# Patient Record
Sex: Male | Born: 1970 | Race: White | Hispanic: No | Smoking: Former smoker
Health system: Southern US, Community
[De-identification: ages and names within clinical notes are randomized; demographics above are authoritative.]

## PROBLEM LIST (undated history)

## (undated) DIAGNOSIS — I509 Heart failure, unspecified: Secondary | ICD-10-CM

## (undated) DIAGNOSIS — I499 Cardiac arrhythmia, unspecified: Secondary | ICD-10-CM

## (undated) DIAGNOSIS — I10 Essential (primary) hypertension: Secondary | ICD-10-CM

## (undated) DIAGNOSIS — E119 Type 2 diabetes mellitus without complications: Secondary | ICD-10-CM

## (undated) HISTORY — DX: Heart failure, unspecified: I50.9

## (undated) HISTORY — DX: Cardiac arrhythmia, unspecified: I49.9

## (undated) SURGERY — Surgical Case
Anesthesia: *Unknown

---

## 2008-12-31 ENCOUNTER — Ambulatory Visit: Payer: Self-pay | Admitting: Internal Medicine

## 2009-12-24 ENCOUNTER — Ambulatory Visit: Payer: Self-pay | Admitting: Family Medicine

## 2013-09-26 ENCOUNTER — Ambulatory Visit: Payer: Self-pay

## 2013-09-26 LAB — RAPID STREP-A WITH REFLX: Micro Text Report: NEGATIVE

## 2013-09-28 LAB — BETA STREP CULTURE(ARMC)

## 2013-12-27 ENCOUNTER — Ambulatory Visit: Payer: Self-pay | Admitting: Family Medicine

## 2013-12-27 LAB — URINALYSIS, COMPLETE
Bilirubin,UR: NEGATIVE
GLUCOSE, UR: NEGATIVE mg/dL (ref 0–75)
KETONE: NEGATIVE
NITRITE: NEGATIVE
PH: 6 (ref 4.5–8.0)
Specific Gravity: >=1.03 (ref 1.003–1.030)
Squamous Epithelial: NONE SEEN

## 2013-12-27 LAB — GC/CHLAMYDIA PROBE AMP

## 2013-12-29 LAB — URINE CULTURE

## 2020-08-31 ENCOUNTER — Other Ambulatory Visit: Payer: Self-pay | Admitting: Gerontology

## 2020-08-31 DIAGNOSIS — I10 Essential (primary) hypertension: Secondary | ICD-10-CM

## 2020-09-13 ENCOUNTER — Ambulatory Visit: Payer: BC Managed Care – PPO

## 2020-09-26 ENCOUNTER — Other Ambulatory Visit: Payer: Self-pay

## 2020-09-26 ENCOUNTER — Ambulatory Visit
Admission: RE | Admit: 2020-09-26 | Discharge: 2020-09-26 | Disposition: A | Discharge status: Home / Self Care | Payer: BC Managed Care – PPO | Source: Ambulatory Visit | Attending: Gerontology | Admitting: Gerontology

## 2020-09-26 DIAGNOSIS — I10 Essential (primary) hypertension: Secondary | ICD-10-CM

## 2020-12-23 ENCOUNTER — Inpatient Hospital Stay
Admission: EM | Admit: 2020-12-23 | Discharge: 2020-12-27 | DRG: 291 | Disposition: A | Discharge status: Home / Self Care | Payer: BC Managed Care – PPO | Attending: Internal Medicine | Admitting: Internal Medicine

## 2020-12-23 ENCOUNTER — Emergency Department: Payer: BC Managed Care – PPO

## 2020-12-23 ENCOUNTER — Other Ambulatory Visit: Payer: Self-pay

## 2020-12-23 ENCOUNTER — Encounter: Payer: Self-pay | Admitting: Emergency Medicine

## 2020-12-23 DIAGNOSIS — E1122 Type 2 diabetes mellitus with diabetic chronic kidney disease: Secondary | ICD-10-CM | POA: Diagnosis present

## 2020-12-23 DIAGNOSIS — E876 Hypokalemia: Secondary | ICD-10-CM

## 2020-12-23 DIAGNOSIS — Z833 Family history of diabetes mellitus: Secondary | ICD-10-CM | POA: Diagnosis not present

## 2020-12-23 DIAGNOSIS — I5031 Acute diastolic (congestive) heart failure: Secondary | ICD-10-CM

## 2020-12-23 DIAGNOSIS — E119 Type 2 diabetes mellitus without complications: Secondary | ICD-10-CM | POA: Diagnosis not present

## 2020-12-23 DIAGNOSIS — I5021 Acute systolic (congestive) heart failure: Secondary | ICD-10-CM | POA: Insufficient documentation

## 2020-12-23 DIAGNOSIS — F17290 Nicotine dependence, other tobacco product, uncomplicated: Secondary | ICD-10-CM | POA: Diagnosis present

## 2020-12-23 DIAGNOSIS — N1831 Chronic kidney disease, stage 3a: Secondary | ICD-10-CM | POA: Insufficient documentation

## 2020-12-23 DIAGNOSIS — I959 Hypotension, unspecified: Secondary | ICD-10-CM | POA: Diagnosis present

## 2020-12-23 DIAGNOSIS — I4891 Unspecified atrial fibrillation: Secondary | ICD-10-CM | POA: Diagnosis present

## 2020-12-23 DIAGNOSIS — Z20822 Contact with and (suspected) exposure to covid-19: Secondary | ICD-10-CM | POA: Diagnosis present

## 2020-12-23 DIAGNOSIS — I130 Hypertensive heart and chronic kidney disease with heart failure and stage 1 through stage 4 chronic kidney disease, or unspecified chronic kidney disease: Principal | ICD-10-CM | POA: Diagnosis present

## 2020-12-23 DIAGNOSIS — R350 Frequency of micturition: Secondary | ICD-10-CM | POA: Diagnosis present

## 2020-12-23 DIAGNOSIS — E785 Hyperlipidemia, unspecified: Secondary | ICD-10-CM | POA: Diagnosis present

## 2020-12-23 DIAGNOSIS — Z8249 Family history of ischemic heart disease and other diseases of the circulatory system: Secondary | ICD-10-CM

## 2020-12-23 DIAGNOSIS — Z79899 Other long term (current) drug therapy: Secondary | ICD-10-CM

## 2020-12-23 DIAGNOSIS — N183 Chronic kidney disease, stage 3 unspecified: Secondary | ICD-10-CM

## 2020-12-23 HISTORY — DX: Essential (primary) hypertension: I10

## 2020-12-23 HISTORY — DX: Type 2 diabetes mellitus without complications: E11.9

## 2020-12-23 LAB — CBC
HCT: 46.2 % (ref 39.0–52.0)
Hemoglobin: 15.9 g/dL (ref 13.0–17.0)
MCH: 30 pg (ref 26.0–34.0)
MCHC: 34.4 g/dL (ref 30.0–36.0)
MCV: 87.2 fL (ref 80.0–100.0)
Platelets: 172 10*3/uL (ref 150–400)
RBC: 5.3 MIL/uL (ref 4.22–5.81)
RDW: 13.2 % (ref 11.5–15.5)
WBC: 6.8 10*3/uL (ref 4.0–10.5)
nRBC: 0 % (ref 0.0–0.2)

## 2020-12-23 LAB — GLUCOSE, CAPILLARY: Glucose-Capillary: 151 mg/dL — ABNORMAL HIGH (ref 70–99)

## 2020-12-23 LAB — BASIC METABOLIC PANEL
Anion gap: 15 (ref 5–15)
BUN: 14 mg/dL (ref 6–20)
CO2: 25 mmol/L (ref 22–32)
Calcium: 9.3 mg/dL (ref 8.9–10.3)
Chloride: 92 mmol/L — ABNORMAL LOW (ref 98–111)
Creatinine, Ser: 1.65 mg/dL — ABNORMAL HIGH (ref 0.61–1.24)
GFR, Estimated: 51 mL/min — ABNORMAL LOW (ref >60)
Glucose, Bld: 149 mg/dL — ABNORMAL HIGH (ref 70–99)
Potassium: 3.2 mmol/L — ABNORMAL LOW (ref 3.5–5.1)
Sodium: 132 mmol/L — ABNORMAL LOW (ref 135–145)

## 2020-12-23 LAB — TROPONIN I (HIGH SENSITIVITY)
Troponin I (High Sensitivity): 36 ng/L — ABNORMAL HIGH (ref <18)
Troponin I (High Sensitivity): 34 ng/L — ABNORMAL HIGH (ref <18)

## 2020-12-23 LAB — BRAIN NATRIURETIC PEPTIDE: B Natriuretic Peptide: 271.7 pg/mL — ABNORMAL HIGH (ref 0.0–100.0)

## 2020-12-23 LAB — MAGNESIUM: Magnesium: 1.3 mg/dL — ABNORMAL LOW (ref 1.7–2.4)

## 2020-12-23 MED ORDER — MAGNESIUM SULFATE 4 GM/100ML IV SOLN
4.0000 g | Freq: Once | INTRAVENOUS | Status: AC
  Administered 2020-12-23: 4 g via INTRAVENOUS
  Filled 2020-12-23 (×2): qty 100

## 2020-12-23 MED ORDER — SODIUM CHLORIDE 0.9 % IV SOLN: 1.0000 g | INTRAVENOUS | Status: DC

## 2020-12-23 MED ORDER — ASPIRIN EC 325 MG PO TBEC: 325.0000 mg | DELAYED_RELEASE_TABLET | Freq: Every day | ORAL | Status: DC

## 2020-12-23 MED ORDER — DILTIAZEM HCL-DEXTROSE 125-5 MG/125ML-% IV SOLN (PREMIX)
5.0000 mg/h | INTRAVENOUS | Status: DC
  Administered 2020-12-23: 5 mg/h via INTRAVENOUS

## 2020-12-23 MED ORDER — SODIUM CHLORIDE 0.9% FLUSH: 3.0000 mL | INTRAVENOUS | Status: DC | PRN

## 2020-12-23 MED ORDER — ENOXAPARIN SODIUM 40 MG/0.4ML ~~LOC~~ SOLN
40.0000 mg | SUBCUTANEOUS | Status: DC
  Administered 2020-12-23: 40 mg via SUBCUTANEOUS
  Filled 2020-12-23: qty 0.4

## 2020-12-23 MED ORDER — ACETAMINOPHEN 650 MG RE SUPP: 650.0000 mg | Freq: Four times a day (QID) | RECTAL | Status: DC | PRN

## 2020-12-23 MED ORDER — POTASSIUM CHLORIDE 20 MEQ PO PACK
40.0000 meq | PACK | Freq: Once | ORAL | Status: AC
  Administered 2020-12-23: 40 meq via ORAL

## 2020-12-23 MED ORDER — INSULIN ASPART 100 UNIT/ML ~~LOC~~ SOLN
0.0000 [IU] | Freq: Three times a day (TID) | SUBCUTANEOUS | Status: DC
  Administered 2020-12-23: 2 [IU] via SUBCUTANEOUS
  Filled 2020-12-23: qty 1

## 2020-12-23 MED ORDER — LACTATED RINGERS IV BOLUS: 1880.0000 mL | Freq: Once | INTRAVENOUS | Status: DC

## 2020-12-23 MED ORDER — ADENOSINE 12 MG/4ML IV SOLN: 12.0000 mg | Freq: Once | INTRAVENOUS | Status: DC

## 2020-12-23 MED ORDER — DILTIAZEM HCL 25 MG/5ML IV SOLN
15.0000 mg | Freq: Once | INTRAVENOUS | Status: AC
  Administered 2020-12-23: 15 mg via INTRAVENOUS

## 2020-12-23 MED ORDER — DIGOXIN 250 MCG PO TABS
0.2500 mg | ORAL_TABLET | Freq: Once | ORAL | Status: AC
  Administered 2020-12-23: 0.25 mg via ORAL
  Filled 2020-12-23: qty 1

## 2020-12-23 MED ORDER — ROSUVASTATIN CALCIUM 10 MG PO TABS
10.0000 mg | ORAL_TABLET | Freq: Every day | ORAL | Status: DC
  Administered 2020-12-24 – 2020-12-27 (×4): 10 mg via ORAL
  Filled 2020-12-23 (×4): qty 1

## 2020-12-23 MED ORDER — ADENOSINE 6 MG/2ML IV SOLN: 6.0000 mg | Freq: Once | INTRAVENOUS | Status: DC

## 2020-12-23 MED ORDER — ADENOSINE 12 MG/4ML IV SOLN
INTRAVENOUS | Status: AC
  Filled 2020-12-23: qty 4

## 2020-12-23 MED ORDER — ONDANSETRON HCL 4 MG/2ML IJ SOLN: 4.0000 mg | Freq: Four times a day (QID) | INTRAMUSCULAR | Status: DC | PRN

## 2020-12-23 MED ORDER — ADENOSINE 6 MG/2ML IV SOLN
INTRAVENOUS | Status: AC
  Filled 2020-12-23: qty 2

## 2020-12-23 MED ORDER — LINAGLIPTIN 5 MG PO TABS
5.0000 mg | ORAL_TABLET | Freq: Every day | ORAL | Status: DC
  Administered 2020-12-23 – 2020-12-27 (×5): 5 mg via ORAL
  Filled 2020-12-23 (×5): qty 1

## 2020-12-23 MED ORDER — METOPROLOL TARTRATE 5 MG/5ML IV SOLN
5.0000 mg | Freq: Once | INTRAVENOUS | Status: AC
  Administered 2020-12-23: 5 mg via INTRAVENOUS
  Filled 2020-12-23: qty 5

## 2020-12-23 MED ORDER — METOPROLOL TARTRATE 5 MG/5ML IV SOLN: 5.0000 mg | Freq: Once | INTRAVENOUS | Status: DC

## 2020-12-23 MED ORDER — DILTIAZEM HCL-DEXTROSE 125-5 MG/125ML-% IV SOLN (PREMIX)
5.0000 mg/h | INTRAVENOUS | Status: DC
  Administered 2020-12-23: 5 mg/h via INTRAVENOUS
  Administered 2020-12-23 – 2020-12-25 (×4): 15 mg/h via INTRAVENOUS
  Filled 2020-12-23 (×6): qty 125

## 2020-12-23 MED ORDER — DILTIAZEM HCL 25 MG/5ML IV SOLN
INTRAVENOUS | Status: AC
  Administered 2020-12-23: 10 mg via INTRAVENOUS
  Filled 2020-12-23: qty 5

## 2020-12-23 MED ORDER — TRAZODONE HCL 50 MG PO TABS
25.0000 mg | ORAL_TABLET | Freq: Every evening | ORAL | Status: DC | PRN
  Administered 2020-12-24 – 2020-12-26 (×3): 25 mg via ORAL
  Filled 2020-12-23 (×3): qty 1

## 2020-12-23 MED ORDER — ACETAMINOPHEN 325 MG PO TABS
650.0000 mg | ORAL_TABLET | Freq: Four times a day (QID) | ORAL | Status: DC | PRN
  Administered 2020-12-24 (×2): 650 mg via ORAL
  Filled 2020-12-23 (×2): qty 2

## 2020-12-23 MED ORDER — MAGNESIUM HYDROXIDE 400 MG/5ML PO SUSP
30.0000 mL | Freq: Every day | ORAL | Status: DC | PRN
Start: 2020-12-23 — End: 2020-12-27

## 2020-12-23 MED ORDER — DILTIAZEM HCL 25 MG/5ML IV SOLN
20.0000 mg | Freq: Once | INTRAVENOUS | Status: AC
  Administered 2020-12-23: 20 mg via INTRAVENOUS

## 2020-12-23 MED ORDER — ONDANSETRON HCL 4 MG PO TABS: 4.0000 mg | ORAL_TABLET | Freq: Four times a day (QID) | ORAL | Status: DC | PRN

## 2020-12-23 MED ORDER — LOSARTAN POTASSIUM 50 MG PO TABS
50.0000 mg | ORAL_TABLET | Freq: Every day | ORAL | Status: DC
  Administered 2020-12-23 – 2020-12-25 (×3): 50 mg via ORAL
  Filled 2020-12-23 (×4): qty 1

## 2020-12-23 MED ORDER — POTASSIUM CHLORIDE 20 MEQ PO PACK
40.0000 meq | PACK | Freq: Once | ORAL | Status: AC
  Administered 2020-12-23: 40 meq via ORAL
  Filled 2020-12-23: qty 2

## 2020-12-23 MED ORDER — DILTIAZEM HCL 25 MG/5ML IV SOLN: 10.0000 mg | Freq: Once | INTRAVENOUS | Status: AC

## 2020-12-23 MED ORDER — SODIUM CHLORIDE 0.9% FLUSH
3.0000 mL | Freq: Two times a day (BID) | INTRAVENOUS | Status: DC
  Administered 2020-12-23 – 2020-12-27 (×8): 3 mL via INTRAVENOUS

## 2020-12-23 MED ORDER — DILTIAZEM HCL 25 MG/5ML IV SOLN
INTRAVENOUS | Status: AC
  Filled 2020-12-23: qty 5

## 2020-12-23 MED ORDER — DILTIAZEM LOAD VIA INFUSION
20.0000 mg | Freq: Once | INTRAVENOUS | Status: AC
  Administered 2020-12-23: 20 mg via INTRAVENOUS
  Filled 2020-12-23: qty 20

## 2020-12-23 MED ORDER — SODIUM CHLORIDE 0.9 % IV SOLN: 250.0000 mL | INTRAVENOUS | Status: DC | PRN

## 2020-12-23 MED ORDER — SODIUM CHLORIDE 0.9 % IV BOLUS
1000.0000 mL | Freq: Once | INTRAVENOUS | Status: AC
  Administered 2020-12-23: 1000 mL via INTRAVENOUS

## 2020-12-23 MED ORDER — FUROSEMIDE 10 MG/ML IJ SOLN
40.0000 mg | Freq: Two times a day (BID) | INTRAMUSCULAR | Status: DC
  Administered 2020-12-23 – 2020-12-24 (×3): 40 mg via INTRAVENOUS
  Filled 2020-12-23 (×3): qty 4

## 2020-12-23 NOTE — ED Provider Notes (Signed)
[facility] Emergency Department Provider Note  ____________________________________________  Time seen: Approximately 5:54 PM  I have reviewed the triage vital signs and the nursing notes.   HISTORY  Chief Complaint Chest Pain and Shortness of Breath    HPI George Shaw is a 50 y.o. male who presents the emergency department  complaining of chest tightness, increasing lower extremity edema.  Patient has a history of hypertension and takes HCTZ for same.  He notes over the past 2 weeks he has had increased chest tightness, as well as peripheral edema.  Today the symptoms continue to increase so he felt like he should "be checked out."  Patient arrived and was noted in triage to have a heart rate of 190 bpm.  Patient denies any pain but again endorses tightness along his chest.  No shortness of breath.  He has no fever, chills, URI symptoms.  No abdominal complaints.  Patient states that he will experience shortness of breath when he lays flat.  Patient denies any other complaints at this time.  No recent illnesses, denies fevers or chills, nasal congestion, sore throat or cough.  No abdominal pain, nausea vomiting or diarrhea or constipation.  No urinary symptoms.        Past Medical History:  Diagnosis Date  . Diabetes mellitus without complication (HCC)   . Hypertension     There are no problems to display for this patient.     Prior to Admission medications   Not on File    Allergies Patient has no known allergies.  No family history on file.  Social History     Review of Systems  Constitutional: No fever/chills Eyes: No visual changes. No discharge ENT: No upper respiratory complaints. Cardiovascular: Positive for chest tightness, increasing peripheral lower extremity edema Respiratory: no cough. No SOB. Gastrointestinal: No abdominal pain.  No nausea, no vomiting.  No diarrhea.  No constipation. Genitourinary: Negative for dysuria.  No hematuria Musculoskeletal: Negative for musculoskeletal pain. Skin: Negative for rash, abrasions, lacerations, ecchymosis. Neurological: Negative for headaches, focal weakness or numbness.  10 System ROS otherwise negative.  ____________________________________________   PHYSICAL EXAM:  VITAL SIGNS: ED Triage Vitals  Enc Vitals Group     BP 12/23/20 1717 (!) 135/108     Pulse Rate 12/23/20 1717 (!) 178     Resp 12/23/20 1717 20     Temp 12/23/20 1717 98 F (36.7 C)     Temp src --      SpO2 12/23/20 1717 98 %     Weight 12/23/20 1712 213 lb (96.6 kg)     Height 12/23/20 1712 6\' 2"  (1.88 m)     Head Circumference --      Peak Flow --      Pain Score 12/23/20 1712 8     Pain Loc --      Pain Edu? --      Excl. in GC? --      Constitutional: Alert and oriented. Well appearing and in no acute distress. Eyes: Conjunctivae are normal. PERRL. EOMI. Head: Atraumatic. ENT:      Ears:       Nose: No congestion/rhinnorhea.      Mouth/Throat: Mucous membranes are moist.  Neck: No stridor.    Cardiovascular: Tachycardia with irregular regular rhythm.  Normal S1 and S2.  Good peripheral circulation. Respiratory: Normal respiratory effort without tachypnea or retractions. Lungs CTAB. Good air entry to the bases with no decreased or absent breath sounds. Musculoskeletal: Full  range of motion to all extremities. No gross deformities appreciated. Neurologic:  Normal speech and language. No gross focal neurologic deficits are appreciated.  Skin:  Skin is warm, dry and intact. No rash noted. Psychiatric: Mood and affect are normal. Speech and behavior are normal. Patient exhibits appropriate insight and judgement.   ____________________________________________   LABS (all labs ordered are listed, but only abnormal results are displayed)  Labs Reviewed  BASIC METABOLIC PANEL - Abnormal; Notable for the following components:      Result Value   Sodium 132 (*)    Potassium 3.2  (*)    Chloride 92 (*)    Glucose, Bld 149 (*)    Creatinine, Ser 1.65 (*)    GFR, Estimated 51 (*)    All other components within normal limits  BRAIN NATRIURETIC PEPTIDE - Abnormal; Notable for the following components:   B Natriuretic Peptide 271.7 (*)    All other components within normal limits  TROPONIN I (HIGH SENSITIVITY) - Abnormal; Notable for the following components:   Troponin I (High Sensitivity) 36 (*)    All other components within normal limits  SARS CORONAVIRUS 2 (TAT 6-24 HRS)  CBC  TROPONIN I (HIGH SENSITIVITY)   ____________________________________________  EKG  ED ECG REPORT I, Delorise Royals Tamantha Saline,  personally viewed and interpreted this ECG.   Date: 12/23/2020  EKG Time: 1713 hrs.  Rate: 185 bpm  Rhythm: there are no previous tracings available for comparison, atrial fibrillation, rate 185 bpm  Axis: Rightward axis  Intervals:none  ST&T Change: No ST elevation or depression noted  A. fib with RVR at a rate of 185 bpm.  No STEMI.  ____________________________________________  RADIOLOGY I personally viewed and evaluated these images as part of my medical decision making, as well as reviewing the written report by the radiologist.  ED Provider Interpretation: No acute cardiopulmonary abnormality  DG Chest Portable 1 View  Result Date: 12/23/2020 CLINICAL DATA:  Chest pain and dyspnea EXAM: PORTABLE CHEST 1 VIEW COMPARISON:  None. FINDINGS: Pacer pad overlies the upper left chest. Top-normal heart size. Normal mediastinal contour. No pneumothorax. No pleural effusion. Lungs appear clear, with no acute consolidative airspace disease and no pulmonary edema. IMPRESSION: No active disease. Electronically Signed   By: Delbert Phenix M.D.   On: 12/23/2020 18:52    ____________________________________________    PROCEDURES  Procedure(s) performed:    Procedures    Medications  diltiazem (CARDIZEM) 125 mg in dextrose 5% 125 mL (1 mg/mL) infusion  (15 mg/hr Intravenous Rate/Dose Change 12/23/20 1924)  digoxin (LANOXIN) tablet 0.25 mg (has no administration in time range)  sodium chloride 0.9 % bolus 1,000 mL (0 mLs Intravenous Stopped 12/23/20 1734)  diltiazem (CARDIZEM) injection 10 mg (10 mg Intravenous Given 12/23/20 1732)  diltiazem (CARDIZEM) injection 15 mg (15 mg Intravenous Given 12/23/20 1738)  diltiazem (CARDIZEM) 1 mg/mL load via infusion 20 mg (20 mg Intravenous Bolus from Bag 12/23/20 1755)  diltiazem (CARDIZEM) injection 20 mg (20 mg Intravenous Given 12/23/20 1747)     ____________________________________________   INITIAL IMPRESSION / ASSESSMENT AND PLAN / ED COURSE  Pertinent labs & imaging results that were available during my care of the patient were reviewed by me and considered in my medical decision making (see chart for details).  Review of the [place] CSRS was performed in accordance of the [facility] prior to dispensing any controlled drugs.           Patient's diagnosis is consistent with A. fib with RVR.  Patient presented to the emergency department complaining of chest tightness.  He states that he has had a progressive increase in symptoms over the last 2 weeks with peripheral lower extremity edema as well.  Patient states that he takes HCTZ for high blood pressure but has no cardiac history.  Strong family cardiac history however.  Patient arrived to triage and was found to have a heart rate of 185 bpm.  On presentation to the room, it appears that rhythm is irregular and patient was started on bolus of Cardizem.  Initial dose of 10, followed by 15, followed by 20 eventually Cardizem drip improved heart rate somewhat but patient remains tachycardic between 140s and 160s at this time.  Patient did have some mild extremity edema on physical exam but BNP is only slightly elevated and no evidence of pulmonary edema on chest x-ray.  Patient remains without chest pain throughout encounter.  Initial troponin of 36.Marland Kitchen  Discussed  the patient with hospitalist for admission.  Hospitalist will accept patient had a mission and but request that we give patient 0.25 mg of digoxin.  Emergency department to accompany his Cardizem drip.    ____________________________________________  FINAL CLINICAL IMPRESSION(S) / ED DIAGNOSES  Final diagnoses:  New onset a-fib (HCC)      NEW MEDICATIONS STARTED DURING THIS VISIT:  ED Discharge Orders    None          This chart was dictated using voice recognition software/Dragon. Despite best efforts to proofread, errors can occur which can change the meaning. Any change was purely unintentional.    Racheal Patches, PA-C 12/23/20 1924    Shaune Pollack, MD 12/24/20 [phone]

## 2020-12-23 NOTE — ED Notes (Signed)
Dr mansy at bedside 

## 2020-12-23 NOTE — ED Triage Notes (Signed)
Pt to ED via POV c/o Chest pain and shortness of breath. Pt states that his symptoms started 2 weeks ago but have gotten progressively worse. Pt reports fluid build up in his legs, ankles, and abdomen. Pt states that the fluid comes and goes. Pt is taking a fluid pill. Pt reports that he is not able to sleep due to the fluid because he feels like there is a lot of pressure on his chest. Pt also states that he is not able to eat because of the fullness in his abdomen. Pt is in NAD at this time.

## 2020-12-23 NOTE — H&P (Addendum)
[place]   PATIENT NAME: George Shaw    MR#:  [id number]  DATE OF BIRTH:  George Shaw, George Shaw  DATE OF ADMISSION:  12/23/2020  PRIMARY CARE PHYSICIAN: Lorenso Quarry, NP   Patient is coming from: Home.  REQUESTING/REFERRING PHYSICIAN: Cuthriell, Delorise Royals, PA-C  CHIEF COMPLAINT:   Chief Complaint  Patient presents with  . Chest Pain  . Shortness of Breath    HISTORY OF PRESENT ILLNESS:  George Shaw is a 50 y.o. Caucasian male with medical history significant for type II diabetes mellitus, hypertension and dyslipidemia, who presented to the emergency room with acute onset of worsening lower extremity edema over the last couple weeks with associated orthopnea and paroxysmal nocturnal dyspnea in addition to dyspnea on exertion.  He has been having mild urinary frequency without urgency or dysuria or flank pain.  Due to symptoms he has been having significant difficulty sleeping.  He stated that he was barely sleep for 45 minutes before he wakes up.  He experience 8/10 chest tightness with his symptoms which has been intermittent over the last week with associated palpitations.  He graded at 8/10 in severity today with no radiation.  He admits to mild dry cough without wheezing.  No nausea or vomiting or diaphoresis or abdominal pain.  No fever or chills. ED Course: When he came to the ER 108 with a heart rate of 178 and otherwise normal vital signs.  Labs revealed hypokalemia of 3.2 and sodium of 132 with chloride 92.  Blood glucose was 149 and creatinine 1.65 with a BUN of 14.  Magnesium level was added and came back 1.3.  High-sensitivity troponin I was 36 and later 34.  BNP was 271.7 and CBC was within normal.    COVID-19 PCR is currently pending.  EKG as reviewed by me : Showed atrial fibrillation with rapid ventricular response of 185 with right axis deviation and Q waves anteroseptally.  It has been as high as 192 with repeat EKG. Imaging: Portable chest x-ray showed no  acute cardiopulmonary disease with top normal heart size.  The patient was given 20 mg of IV Cardizem followed by IV Cardizem drip which has been titrated to 15 mg/h.  He was also given digoxin 0.25 mg IV and Lopressor 5 mg IV.  He was ordered replacement of his potassium with 40 mEq p.o. potassium chloride now in 2 hours as well as 4 g IV magnesium sulfate.  He will be admitted to a progressive unit bed for further evaluation and management. PAST MEDICAL HISTORY:   Past Medical History:  Diagnosis Date  . Diabetes mellitus without complication (HCC)   . Hypertension   -Dyslipidemia.  PAST SURGICAL HISTORY:   He denies any previous surgeries.  SOCIAL HISTORY:   Social History   Tobacco Use  . Smoking status: Not on file  . Smokeless tobacco: Not on file  Substance Use Topics  . Alcohol use: Not on file  He drinks alcohol about 3 times a week and smokes cigars.  No history of illicit drug use.  FAMILY HISTORY:  Positive for MI in both grandparents and diabetes mellitus in his father and grandfather.  DRUG ALLERGIES:  No Known Allergies  REVIEW OF SYSTEMS:   ROS As per history of present illness. All pertinent systems were reviewed above. Constitutional, HEENT, cardiovascular, respiratory, GI, GU, musculoskeletal, neuro, psychiatric, endocrine, integumentary and hematologic systems were reviewed and are otherwise negative/unremarkable except for positive findings mentioned above in the HPI.  MEDICATIONS AT HOME:   Prior to Admission medications   Not on File      VITAL SIGNS:  Blood pressure 116/79, pulse 99, temperature 98 F (36.7 C), resp. rate (!) 23, height 6\' 2"  (1.88 m), weight 96.6 kg, SpO2 100 %.  PHYSICAL EXAMINATION:  Physical Exam  GENERAL:  50 y.o.-year-old Caucasian male patient lying in the bed with no acute distress.  EYES: Pupils equal, round, reactive to light and accommodation. No scleral icterus. Extraocular muscles intact.  HEENT: Head  atraumatic, normocephalic. Oropharynx and nasopharynx clear.  NECK:  Supple, no jugular venous distention. No thyroid enlargement, no tenderness.  LUNGS: Normal breath sounds bilaterally, no wheezing, rales,rhonchi or crepitation. No use of accessory muscles of respiration.  CARDIOVASCULAR: Irregularly irregular tachycardic rhythm, S1, S2 normal. No murmurs, rubs, or gallops.  ABDOMEN: Soft, nondistended, nontender. Bowel sounds present. No organomegaly or mass.  EXTREMITIES: 2+ bilateral lower extremity pitting edema, with no cyanosis, or clubbing.  NEUROLOGIC: Cranial nerves II through XII are intact. Muscle strength 5/5 in all extremities. Sensation intact. Gait not checked.  PSYCHIATRIC: The patient is alert and oriented x 3.  Normal affect and good eye contact. SKIN: No obvious rash, lesion, or ulcer.   LABORATORY PANEL:   CBC Recent Labs  Lab 12/23/20 1720  WBC 6.8  HGB 15.9  HCT 46.2  PLT 172   ------------------------------------------------------------------------------------------------------------------  Chemistries  Recent Labs  Lab 12/23/20 1720 12/23/20 1928  NA 132*  --   K 3.2*  --   CL 92*  --   CO2 25  --   GLUCOSE 149*  --   BUN 14  --   CREATININE 1.65*  --   CALCIUM 9.3  --   MG  --  1.3*   ------------------------------------------------------------------------------------------------------------------  Cardiac Enzymes No results for input(s): TROPONINI in the last 168 hours. ------------------------------------------------------------------------------------------------------------------  RADIOLOGY:  DG Chest Portable 1 View  Result Date: 12/23/2020 CLINICAL DATA:  Chest pain and dyspnea EXAM: PORTABLE CHEST 1 VIEW COMPARISON:  None. FINDINGS: Pacer pad overlies the upper left chest. Top-normal heart size. Normal mediastinal contour. No pneumothorax. No pleural effusion. Lungs appear clear, with no acute consolidative airspace disease and no  pulmonary edema. IMPRESSION: No active disease. Electronically Signed   By: 12/25/2020 M.D.   On: 12/23/2020 18:52      IMPRESSION AND PLAN:  Active Problems:   Atrial fibrillation with rapid ventricular response (HCC)  1.  New onset atrial fibrillation with rapid ventricular response. -The patient will be admitted to a progressive unit bed. -We will continue on IV Cardizem and is currently maxed. -We give him a dose of digoxin 0.25 mg IV and Lopressor 5 mg IV. -We noticed improvement with IV Lopressor and it may be repeated. -2D echo and a cardiology consult will be obtained. -I notified Dr. 12/25/2020 about the patient. -His CHA2DS2-VASc score is 1 based on his hypertension and to based on confirmation of his acute CHF. -We will place him now on enteric-coated aspirin 325 mg p.o. daily awaiting 2D echo and cardiology evaluation.  He may need anticoagulation.  2.  Acute new onset CHF likely diastolic. -He has dyspnea, orthopnea and paroxysmal nocturnal dyspnea as well as worsening lower extremity edema with dyspnea on exertion and elevated BNP, though he does not have pulmonary edema.. -He will be diuresed with IV Lasix.  2D echo and a cardiology consult will be obtained as mentioned above.  3.  Hypokalemia and hypomagnesemia. -Both are clearly contributing to  his atrial fibrillation with rapid ventricular response. -Both will be aggressively replaced.  4.  Type 2 diabetes mellitus. -We will continue Januvia and hold off Metformin. -He will be placed on supplement coverage with NovoLog.  5.  Essential hypertension. -We will continue his Cozaar and hold off his HCTZ for now.  6.  Dyslipidemia. -We will continue statin therapy.  DVT prophylaxis: Lovenox. Code Status: full code. Family Communication:  The plan of care was discussed in details with the patient (and his wife). I answered all questions. The patient agreed to proceed with the above mentioned plan. Further management  will depend upon hospital course. Disposition Plan: Back to previous home environment Consults called: Cardiology consult as above. All the records are reviewed and case discussed with ED provider.  Status is: Inpatient  Remains inpatient appropriate because:Hemodynamically unstable, Ongoing diagnostic testing needed not appropriate for outpatient work up, Unsafe d/c plan, IV treatments appropriate due to intensity of illness or inability to take PO and Inpatient level of care appropriate due to severity of illness   Dispo: The patient is from: Home              Anticipated d/c is to: Home              Patient currently is not medically stable to d/c.   Difficult to place patient No   TOTAL TIME TAKING CARE OF THIS PATIENT: 60 minutes.    George Shaw M.D on 12/23/2020 at 8:23 PM  [facility]   From 7 PM-7 AM, contact night-coverage [email or web address]  CC: Primary care physician; Lorenso Quarry, NP

## 2020-12-24 ENCOUNTER — Encounter: Payer: Self-pay | Admitting: Family Medicine

## 2020-12-24 ENCOUNTER — Inpatient Hospital Stay (HOSPITAL_COMMUNITY)
Admit: 2020-12-24 | Discharge: 2020-12-24 | Disposition: A | Discharge status: Home / Self Care | Payer: BC Managed Care – PPO | Attending: Family Medicine | Admitting: Family Medicine

## 2020-12-24 DIAGNOSIS — I5021 Acute systolic (congestive) heart failure: Secondary | ICD-10-CM | POA: Insufficient documentation

## 2020-12-24 DIAGNOSIS — I5031 Acute diastolic (congestive) heart failure: Secondary | ICD-10-CM | POA: Diagnosis not present

## 2020-12-24 DIAGNOSIS — E876 Hypokalemia: Secondary | ICD-10-CM

## 2020-12-24 DIAGNOSIS — N1831 Chronic kidney disease, stage 3a: Secondary | ICD-10-CM | POA: Insufficient documentation

## 2020-12-24 DIAGNOSIS — E119 Type 2 diabetes mellitus without complications: Secondary | ICD-10-CM

## 2020-12-24 DIAGNOSIS — N183 Chronic kidney disease, stage 3 unspecified: Secondary | ICD-10-CM

## 2020-12-24 DIAGNOSIS — I4891 Unspecified atrial fibrillation: Secondary | ICD-10-CM

## 2020-12-24 LAB — CBC
HCT: 44 % (ref 39.0–52.0)
Hemoglobin: 15.4 g/dL (ref 13.0–17.0)
MCH: 30.9 pg (ref 26.0–34.0)
MCHC: 35 g/dL (ref 30.0–36.0)
MCV: 88.2 fL (ref 80.0–100.0)
Platelets: 133 10*3/uL — ABNORMAL LOW (ref 150–400)
RBC: 4.99 MIL/uL (ref 4.22–5.81)
RDW: 13.3 % (ref 11.5–15.5)
WBC: 5.4 10*3/uL (ref 4.0–10.5)
nRBC: 0 % (ref 0.0–0.2)

## 2020-12-24 LAB — ECHOCARDIOGRAM COMPLETE
AR max vel: 2.89 cm2
AV Area VTI: 2.88 cm2
AV Area mean vel: 2.69 cm2
AV Mean grad: 3 mmHg
AV Peak grad: 5.5 mmHg
Ao pk vel: 1.17 m/s
Area-P 1/2: 6.74 cm2
Calc EF: 40.3 %
Height: 74 in
MV VTI: 3.5 cm2
S' Lateral: 4.6 cm
Single Plane A2C EF: 32.7 %
Single Plane A4C EF: 50.5 %
Weight: 3766.4 oz

## 2020-12-24 LAB — BASIC METABOLIC PANEL
Anion gap: 14 (ref 5–15)
BUN: 13 mg/dL (ref 6–20)
CO2: 25 mmol/L (ref 22–32)
Calcium: 9.1 mg/dL (ref 8.9–10.3)
Chloride: 95 mmol/L — ABNORMAL LOW (ref 98–111)
Creatinine, Ser: 1.61 mg/dL — ABNORMAL HIGH (ref 0.61–1.24)
GFR, Estimated: 53 mL/min — ABNORMAL LOW (ref >60)
Glucose, Bld: 148 mg/dL — ABNORMAL HIGH (ref 70–99)
Potassium: 3.2 mmol/L — ABNORMAL LOW (ref 3.5–5.1)
Sodium: 134 mmol/L — ABNORMAL LOW (ref 135–145)

## 2020-12-24 LAB — HEMOGLOBIN A1C
Hgb A1c MFr Bld: 7.3 % — ABNORMAL HIGH (ref 4.8–5.6)
Mean Plasma Glucose: 162.81 mg/dL

## 2020-12-24 LAB — GLUCOSE, CAPILLARY
Glucose-Capillary: 149 mg/dL — ABNORMAL HIGH (ref 70–99)
Glucose-Capillary: 172 mg/dL — ABNORMAL HIGH (ref 70–99)
Glucose-Capillary: 176 mg/dL — ABNORMAL HIGH (ref 70–99)
Glucose-Capillary: 177 mg/dL — ABNORMAL HIGH (ref 70–99)

## 2020-12-24 LAB — HIV ANTIBODY (ROUTINE TESTING W REFLEX): HIV Screen 4th Generation wRfx: NONREACTIVE

## 2020-12-24 LAB — SARS CORONAVIRUS 2 (TAT 6-24 HRS): SARS Coronavirus 2: NEGATIVE

## 2020-12-24 LAB — MAGNESIUM: Magnesium: 1.9 mg/dL (ref 1.7–2.4)

## 2020-12-24 MED ORDER — APIXABAN 5 MG PO TABS
5.0000 mg | ORAL_TABLET | Freq: Two times a day (BID) | ORAL | Status: DC
  Administered 2020-12-24 – 2020-12-27 (×7): 5 mg via ORAL
  Filled 2020-12-24 (×7): qty 1

## 2020-12-24 MED ORDER — METOPROLOL TARTRATE 5 MG/5ML IV SOLN
5.0000 mg | Freq: Four times a day (QID) | INTRAVENOUS | Status: DC | PRN
  Administered 2020-12-25 – 2020-12-26 (×2): 5 mg via INTRAVENOUS
  Filled 2020-12-24 (×2): qty 5

## 2020-12-24 MED ORDER — APIXABAN 5 MG PO TABS: 5.0000 mg | ORAL_TABLET | Freq: Two times a day (BID) | ORAL | Status: DC

## 2020-12-24 MED ORDER — INSULIN ASPART 100 UNIT/ML ~~LOC~~ SOLN
3.0000 [IU] | Freq: Three times a day (TID) | SUBCUTANEOUS | Status: DC
  Administered 2020-12-24 – 2020-12-27 (×10): 3 [IU] via SUBCUTANEOUS
  Filled 2020-12-24 (×10): qty 1

## 2020-12-24 MED ORDER — MAGNESIUM SULFATE 2 GM/50ML IV SOLN
2.0000 g | Freq: Once | INTRAVENOUS | Status: AC
  Administered 2020-12-24: 2 g via INTRAVENOUS
  Filled 2020-12-24: qty 50

## 2020-12-24 MED ORDER — INSULIN ASPART 100 UNIT/ML ~~LOC~~ SOLN
0.0000 [IU] | Freq: Three times a day (TID) | SUBCUTANEOUS | Status: DC
  Administered 2020-12-24: 2 [IU] via SUBCUTANEOUS
  Administered 2020-12-24 – 2020-12-25 (×2): 3 [IU] via SUBCUTANEOUS
  Administered 2020-12-25 – 2020-12-27 (×6): 2 [IU] via SUBCUTANEOUS
  Filled 2020-12-24 (×9): qty 1

## 2020-12-24 MED ORDER — METOPROLOL TARTRATE 25 MG PO TABS
25.0000 mg | ORAL_TABLET | Freq: Two times a day (BID) | ORAL | Status: DC
  Administered 2020-12-24 – 2020-12-26 (×5): 25 mg via ORAL
  Filled 2020-12-24 (×5): qty 1

## 2020-12-24 MED ORDER — INSULIN ASPART 100 UNIT/ML ~~LOC~~ SOLN
0.0000 [IU] | Freq: Every day | SUBCUTANEOUS | Status: DC
  Administered 2020-12-25: 2 [IU] via SUBCUTANEOUS
  Filled 2020-12-24: qty 1

## 2020-12-24 MED ORDER — POTASSIUM CHLORIDE CRYS ER 20 MEQ PO TBCR
40.0000 meq | EXTENDED_RELEASE_TABLET | Freq: Three times a day (TID) | ORAL | Status: AC
  Administered 2020-12-24 (×3): 40 meq via ORAL
  Filled 2020-12-24 (×3): qty 2

## 2020-12-24 MED ORDER — TRAMADOL HCL 50 MG PO TABS
50.0000 mg | ORAL_TABLET | Freq: Four times a day (QID) | ORAL | Status: DC | PRN
  Administered 2020-12-24 – 2020-12-26 (×2): 50 mg via ORAL
  Filled 2020-12-24 (×2): qty 1

## 2020-12-24 NOTE — Progress Notes (Signed)
   12/24/20 1100  Clinical Encounter Type  Visited With Patient and family together  Visit Type Initial;Social support;Spiritual support  Referral From Chaplain  Consult/Referral To Chaplain   Chaplain checked on PT, while doing rounds. Chaplain ministered with a calming presence, and reflective listening. PT had a visitor, so Chaplain stated he would do a follow up visit.

## 2020-12-24 NOTE — Consult Note (Signed)
CARDIOLOGY CONSULT NOTE               Patient ID: George Shaw MRN: [id number] DOB/AGE: 18-Oct-1970 50 y.o.  Admit date: 12/23/2020 Referring Physician [facility] Primary Physician [facility] Primary Cardiologist none per patient Reason for Consultation new onset atrial fibrillation with RVR and acute CHF  HPI: 50 year old gentleman referred for evaluation of new onset atrial fibrillation with RVR and acute CHF. The patient has a history of hypertension, type II diabetes, hyperlipidemia, and alcohol use. The patient reports a 2 week history of progressive lower extremity edema, orthopnea, exertional dyspnea, and chest heaviness with and without exertion. The patient denies symptoms like this in the past. The patient presented to [facility] ER yesterday due to worsening of symptoms and palpitations yesterday. In the ER, he was noted to be in atrial fibrillation with RVR at a rate of 185 bpm. He was given IV diltiazem, IV lopressor, and digoxin, then started on diltiazem drip, aspirin, and IV Lasix. Chest xray showed no evidence of pleural effusion or edema. Admission labs notable for potassium 3.2, BUN 14, creatinine 1.65, high sensitivity troponin 36 and 34, BNP 271, magnesium 1.3. Currently, the patient reports feeling better; his swelling has improved. He currently denies palpitations. He reports a mild constant left sided chest heaviness that is worse when lying down and better when leaning forward, and is non-pleuritic. The patient denies a known history of sleep apnea. He drinks several beers at a time 3 times a week. He denies a history of a brain bleed, GI bleed, or blood transfusion.  Review of systems complete and found to be negative unless listed above     Past Medical History:  Diagnosis Date  . Diabetes mellitus without complication (HCC)   . Hypertension     History reviewed. No pertinent surgical history.  No medications prior to admission.   Social History   Socioeconomic  History  . Marital status: Legally Separated    Spouse name: Not on file  . Number of children: Not on file  . Years of education: Not on file  . Highest education level: Not on file  Occupational History  . Not on file  Tobacco Use  . Smoking status: Light Tobacco Smoker    Types: Cigars  . Smokeless tobacco: Not on file  Substance and Sexual Activity  . Alcohol use: Not on file  . Drug use: Not on file  . Sexual activity: Not on file  Other Topics Concern  . Not on file  Social History Narrative  . Not on file   Social Determinants of Health   Financial Resource Strain: Not on file  Food Insecurity: Not on file  Transportation Needs: Not on file  Physical Activity: Not on file  Stress: Not on file  Social Connections: Not on file  Intimate Partner Violence: Not on file    History reviewed. No pertinent family history.    Review of systems complete and found to be negative unless listed above      PHYSICAL EXAM  General: Well developed, well nourished, in no acute distress, sitting up in bed, smiling, appears comfortable. HEENT:  Normocephalic and atramatic Neck:  No JVD.  Lungs: Clear bilaterally to auscultation and normal effort of breathing on RA Heart: irregularly irregular, without gallops or murmurs.  Msk:  Back normal, gait not assessed. Normal strength and tone for age. Extremities: No clubbing, cyanosis with 1+ bilateral ankle edema.   Neuro: Alert and oriented X 3. Psych:  Peri [place]  affect, responds appropriately  Labs:   Lab Results  Component Value Date   WBC 5.4 12/24/2020   HGB 15.4 12/24/2020   HCT 44.0 12/24/2020   MCV 88.2 12/24/2020   PLT 133 (L) 12/24/2020    Recent Labs  Lab 12/24/20 0420  NA 134*  K 3.2*  CL 95*  CO2 25  BUN 13  CREATININE 1.61*  CALCIUM 9.1  GLUCOSE 148*   No results found for: CKTOTAL, CKMB, CKMBINDEX, TROPONINI No results found for: CHOL No results found for: HDL No results found for: LDLCALC No results  found for: TRIG No results found for: CHOLHDL No results found for: LDLDIRECT    Radiology: DG Chest Portable 1 View  Result Date: 12/23/2020 CLINICAL DATA:  Chest pain and dyspnea EXAM: PORTABLE CHEST 1 VIEW COMPARISON:  None. FINDINGS: Pacer pad overlies the upper left chest. Top-normal heart size. Normal mediastinal contour. No pneumothorax. No pleural effusion. Lungs appear clear, with no acute consolidative airspace disease and no pulmonary edema. IMPRESSION: No active disease. Electronically Signed   By: Delbert Phenix M.D.   On: 12/23/2020 18:52    EKG: atrial fibrillation with RVR  ASSESSMENT AND PLAN:  1. New onset atrial fibrillation, with a chads vasc score of 3 (HTN, T2DM, CHF), with a history of alcohol use (drinks several beers 3x/week), and possible OSA.Currently 2. Acute CHF, likely diastolic and likely precipitated by new onset atrial fibrillation. Patient reports a 2-week history of peripheral edema, exertional dyspnea, orthopnea, and constant chest heaviness with and without exertion. Chest xray negative for pulmonary edema. BNP mildly elevated to 271, high sensitivity troponin mildly elevated and down-trending (36 ->34). Started on IV Lasix. 3. Hypertension, low this morning.  4. Type II diabetes 5. Hyperlipidemia, on Crestor  Plan: 1. Agree with current therapy 2. Discontinue aspirin, start Eliquis 5 mg BID 3. Continue diltiazem drip for now; continue metoprolol tartrate 25 mg BID, and PRN digoxin 4. Continue Crestor  5. Continue IV Lasix for now with careful monitoring of blood pressure and renal function 6. 2D echocardiogram 7. Supplement potassium and recheck in morning 8. Counseled patient about limiting alcohol consumption 9. Further recommendations pending patient's initial course and results of echo.   Signed: Leanora Ivanoff PA-C 12/24/2020, 8:50 AM

## 2020-12-24 NOTE — Progress Notes (Signed)
*  PRELIMINARY RESULTS* Echocardiogram 2D Echocardiogram has been performed.  George Shaw 12/24/2020, 3:32 PM

## 2020-12-24 NOTE — Progress Notes (Addendum)
[facility] PROGRESS NOTE    Progress Note  George Shaw  ZOX:[id number] DOB: Aug 06, 1971 DOA: 12/23/2020 PCP: Lorenso Quarry, NP     Brief Narrative:   George Shaw is an 50 y.o. male past medical history significant for diabetes mellitus type 2, essential hypertension comes into the emergency room for acute lower extremity edema, PND and dyspnea on exertion that started the week prior to admission.  In the ED she was found to be tachycardic with a rate of 178, mildly hypokalemic creatinine of 1.6, magnesium 1.3 cardiac biomarkers have remained flat, BNP of 271.  12-lead EKG showed A. fib with RVR  Significant studies: 12/23/2020 chest x-ray showed no acute cardiopulmonary disease. 12/25/2018 two 2D echo  Antibiotics: None  Microbiology data: Blood culture:  Procedures: None  Assessment/Plan:   Atrial fibrillation with rapid ventricular response (HCC): Started on IV Cardizem drip despite this his heart rate continues to be elevated.  Continue digoxin, will start him on oral metoprolol and IV metoprolol as needed as needed for heart rate greater than 100.  Cardiac biomarkers have basically remained flat. His heart rate has somewhat improved from the 160s 170s to the low 100s. 2D echocardiogram has been ordered, cardiology has been consulted. Chads Vascor is 1, will await 2D echo as if he has heart failure will make him a candidate for anticoagulation.  Acute diastolic heart failure: Dyspnea orthopnea with a mildly elevated BNP. He is definitely fluid overloaded on physical exam. 2D echo has been ordered, he was started on IV Lasix and fluid restriction. He is negative about 3 L. Continue IV Lasix monitor strict I's and O's and daily weights.  Chronic kidney disease stage IIIa: No previous creatinine to compare with.  Question baseline the setting of hypertension diabetes mellitus.  Electrolyte imbalance hypokalemia/hypomagnesemia: They have remain low despite  repletion likely due to diuresis. We will replete potassium orally more aggressively.  Diabetes mellitus without complications: Hold all oral hypoglycemic agents.  A1c is pending. We will start him on long-acting insulin plus sliding scale.  Essential hypertension: Blood pressure slightly elevated he was continued on ARB, hydrochlorothiazide was discontinued. Started on IV Lasix.  DVT prophylaxis: lovenox Family Communication:Wife Status is: Inpatient  Remains inpatient appropriate because:Hemodynamically unstable   Dispo: The patient is from: Home              Anticipated d/c is to: Home              Patient currently is not medically stable to d/c.   Difficult to place patient No        Code Status:     Code Status Orders  (From admission, onward)         Start     Ordered   12/23/20 2021  Full code  Continuous        12/23/20 2023        Code Status History    This patient has a current code status but no historical code status.   Advance Care Planning Activity        IV Access:    Peripheral IV   Procedures and diagnostic studies:   DG Chest Portable 1 View  Result Date: 12/23/2020 CLINICAL DATA:  Chest pain and dyspnea EXAM: PORTABLE CHEST 1 VIEW COMPARISON:  None. FINDINGS: Pacer pad overlies the upper left chest. Top-normal heart size. Normal mediastinal contour. No pneumothorax. No pleural effusion. Lungs appear clear, with no acute consolidative airspace disease and no pulmonary edema. IMPRESSION:  No active disease. Electronically Signed   By: Delbert Phenix M.D.   On: 12/23/2020 18:52     Medical Consultants:    None.   Subjective:    George Shaw he relates his breathing is better some chest tightness but no chest pain per se.  Objective:    Vitals:   12/24/20 0000 12/24/20 0200 12/24/20 0358 12/24/20 0400  BP: 112/88 117/84  126/74  Pulse: (!) 116   (!) 104  Resp: 18 20  14   Temp: 98.4 F (36.9 C)   98.6 F (37 C)   TempSrc: Oral   Oral  SpO2: 98%   100%  Weight: 106.8 kg  106.8 kg   Height:       SpO2: 100 %   Intake/Output Summary (Last 24 hours) at 12/24/2020 0720 Last data filed at 12/24/2020 0600 Gross per 24 hour  Intake 429.59 ml  Output 3350 ml  Net -2920.41 ml   Filed Weights   12/23/20 1712 12/24/20 0000 12/24/20 0358  Weight: 96.6 kg 106.8 kg 106.8 kg    Exam: General exam: In no acute distress. Respiratory system: Good air movement and still with some crackles on the right Cardiovascular system: S1 & S2 heard, RRR.  Positive JVD Gastrointestinal system: Abdomen is nondistended, soft and nontender.  Extremities: No pedal edema. Skin: No rashes, lesions or ulcers Psychiatry: Judgement and insight appear normal. Mood & affect appropriate.    Data Reviewed:    Labs: Basic Metabolic Panel: Recent Labs  Lab 12/23/20 1720 12/23/20 1928 12/24/20 0420  NA 132*  --  134*  K 3.2*  --  3.2*  CL 92*  --  95*  CO2 25  --  25  GLUCOSE 149*  --  148*  BUN 14  --  13  CREATININE 1.65*  --  1.61*  CALCIUM 9.3  --  9.1  MG  --  1.3*  --    GFR Estimated Creatinine Clearance: 73.8 mL/min (A) (by C-G formula based on SCr of 1.61 mg/dL (H)). Liver Function Tests: No results for input(s): AST, ALT, ALKPHOS, BILITOT, PROT, ALBUMIN in the last 168 hours. No results for input(s): LIPASE, AMYLASE in the last 168 hours. No results for input(s): AMMONIA in the last 168 hours. Coagulation profile No results for input(s): INR, PROTIME in the last 168 hours. COVID-19 Labs  No results for input(s): DDIMER, FERRITIN, LDH, CRP in the last 72 hours.  Lab Results  Component Value Date   SARSCOV2NAA NEGATIVE 12/23/2020    CBC: Recent Labs  Lab 12/23/20 1720 12/24/20 0420  WBC 6.8 5.4  HGB 15.9 15.4  HCT 46.2 44.0  MCV 87.2 88.2  PLT 172 133*   Cardiac Enzymes: No results for input(s): CKTOTAL, CKMB, CKMBINDEX, TROPONINI in the last 168 hours. BNP (last 3 results) No results  for input(s): PROBNP in the last 8760 hours. CBG: Recent Labs  Lab 12/23/20 2056  GLUCAP 151*   D-Dimer: No results for input(s): DDIMER in the last 72 hours. Hgb A1c: No results for input(s): HGBA1C in the last 72 hours. Lipid Profile: No results for input(s): CHOL, HDL, LDLCALC, TRIG, CHOLHDL, LDLDIRECT in the last 72 hours. Thyroid function studies: No results for input(s): TSH, T4TOTAL, T3FREE, THYROIDAB in the last 72 hours.  Invalid input(s): FREET3 Anemia work up: No results for input(s): VITAMINB12, FOLATE, FERRITIN, TIBC, IRON, RETICCTPCT in the last 72 hours. Sepsis Labs: Recent Labs  Lab 12/23/20 1720 12/24/20 0420  WBC 6.8 5.4  Microbiology Recent Results (from the past 240 hour(s))  SARS CORONAVIRUS 2 (TAT 6-24 HRS) Nasopharyngeal Nasopharyngeal Swab     Status: None   Collection Time: 12/23/20  7:24 PM   Specimen: Nasopharyngeal Swab  Result Value Ref Range Status   SARS Coronavirus 2 NEGATIVE NEGATIVE Final    Comment: (NOTE) SARS-CoV-2 target nucleic acids are NOT DETECTED.  The SARS-CoV-2 RNA is generally detectable in upper and lower respiratory specimens during the acute phase of infection. Negative results do not preclude SARS-CoV-2 infection, do not rule out co-infections with other pathogens, and should not be used as the sole basis for treatment or other patient management decisions. Negative results must be combined with clinical observations, patient history, and epidemiological information. The expected result is Negative.  Fact Sheet for Patients: HairSlick.no  Fact Sheet for Healthcare Providers: quierodirigir.com  This test is not yet approved or cleared by the [place] FDA and  has been authorized for detection and/or diagnosis of SARS-CoV-2 by FDA under an Emergency Use Authorization (EUA). This EUA will remain  in effect (meaning this test can be used) for the duration of  the COVID-19 declaration under Se ction 564(b)(1) of the Act, 21 U.S.C. section 360bbb-3(b)(1), unless the authorization is terminated or revoked sooner.  Performed at [facility], [address], [place], [place] [address]      Medications:   . aspirin EC  325 mg Oral Daily  . enoxaparin (LOVENOX) injection  40 mg Subcutaneous Q24H  . furosemide  40 mg Intravenous Q12H  . insulin aspart  0-9 Units Subcutaneous TID PC & HS  . linagliptin  5 mg Oral Daily  . losartan  50 mg Oral Daily  . rosuvastatin  10 mg Oral Daily  . sodium chloride flush  3 mL Intravenous Q12H   Continuous Infusions: . sodium chloride    . diltiazem (CARDIZEM) infusion 15 mg/hr (12/24/20 0655)      LOS: 1 day   Marinda Elk  [facility]  12/24/2020, 7:20 AM

## 2020-12-24 NOTE — Progress Notes (Signed)
   12/23/20 2043  Assess: MEWS Score  Temp 97.8 F (36.6 C)  BP (!) 127/96  Pulse Rate (!) 118  Resp 18  Level of Consciousness Alert  SpO2 100 %  O2 Device Room Air  Assess: MEWS Score  MEWS Temp 0  MEWS Systolic 0  MEWS Pulse 2  MEWS RR 0  MEWS LOC 0  MEWS Score 2  MEWS Score Color Yellow  Assess: if the MEWS score is Yellow or Red  Were vital signs taken at a resting state? Yes  Focused Assessment No change from prior assessment  Early Detection of Sepsis Score *See Row Information* Low  MEWS guidelines implemented *See Row Information* Yes  Treat  MEWS Interventions Administered scheduled meds/treatments;Administered prn meds/treatments  Pain Scale 0-10  Pain Score 0  Take Vital Signs  Increase Vital Sign Frequency  Yellow: Q 2hr X 2 then Q 4hr X 2, if remains yellow, continue Q 4hrs  Escalate  MEWS: Escalate Yellow: discuss with charge nurse/RN and consider discussing with provider and RRT  Notify: Charge Nurse/RN  Name of Charge Nurse/RN Notified Diannia Ruder, CN  Date Charge Nurse/RN Notified 12/23/20  Time Charge Nurse/RN Notified 2046  Document  Patient Outcome Stabilized after interventions  Progress note created (see row info) Yes

## 2020-12-25 DIAGNOSIS — N1831 Chronic kidney disease, stage 3a: Secondary | ICD-10-CM

## 2020-12-25 LAB — BASIC METABOLIC PANEL
Anion gap: 12 (ref 5–15)
BUN: 18 mg/dL (ref 6–20)
CO2: 24 mmol/L (ref 22–32)
Calcium: 8.9 mg/dL (ref 8.9–10.3)
Chloride: 94 mmol/L — ABNORMAL LOW (ref 98–111)
Creatinine, Ser: 1.64 mg/dL — ABNORMAL HIGH (ref 0.61–1.24)
GFR, Estimated: 52 mL/min — ABNORMAL LOW (ref >60)
Glucose, Bld: 206 mg/dL — ABNORMAL HIGH (ref 70–99)
Potassium: 3.9 mmol/L (ref 3.5–5.1)
Sodium: 130 mmol/L — ABNORMAL LOW (ref 135–145)

## 2020-12-25 LAB — GLUCOSE, CAPILLARY
Glucose-Capillary: 210 mg/dL — ABNORMAL HIGH (ref 70–99)
Glucose-Capillary: 101 mg/dL — ABNORMAL HIGH (ref 70–99)
Glucose-Capillary: 189 mg/dL — ABNORMAL HIGH (ref 70–99)
Glucose-Capillary: 216 mg/dL — ABNORMAL HIGH (ref 70–99)

## 2020-12-25 MED ORDER — DILTIAZEM HCL 30 MG PO TABS
60.0000 mg | ORAL_TABLET | Freq: Four times a day (QID) | ORAL | Status: DC
  Administered 2020-12-25 (×2): 60 mg via ORAL
  Filled 2020-12-25 (×2): qty 2

## 2020-12-25 MED ORDER — DILTIAZEM HCL 30 MG PO TABS
90.0000 mg | ORAL_TABLET | Freq: Four times a day (QID) | ORAL | Status: DC
  Administered 2020-12-25 – 2020-12-26 (×2): 90 mg via ORAL
  Filled 2020-12-25 (×2): qty 3

## 2020-12-25 MED ORDER — FUROSEMIDE 10 MG/ML IJ SOLN
40.0000 mg | Freq: Once | INTRAMUSCULAR | Status: AC
  Administered 2020-12-25: 40 mg via INTRAVENOUS
  Filled 2020-12-25: qty 4

## 2020-12-25 MED ORDER — CALCIUM CARBONATE ANTACID 500 MG PO CHEW
1.0000 | CHEWABLE_TABLET | Freq: Four times a day (QID) | ORAL | Status: DC | PRN
  Administered 2020-12-25: 200 mg via ORAL
  Filled 2020-12-25: qty 1

## 2020-12-25 NOTE — Progress Notes (Signed)
[facility] PROGRESS NOTE    Progress Note  George CobbsMatthew Cheek  ZOX:[id number] DOB: 12/05/1970 DOA: 12/23/2020 PCP: Lorenso QuarryLeach, Shannon, NP     Brief Narrative:   George Shaw is an 50 y.o. male past medical history significant for diabetes mellitus type 2, essential hypertension comes into the emergency room for acute lower extremity edema, PND and dyspnea on exertion that started the week prior to admission.  In the ED she was found to be tachycardic with a rate of 178, mildly hypokalemic creatinine of 1.6, magnesium 1.3 cardiac biomarkers have remained flat, BNP of 271.  12-lead EKG showed A. fib with RVR  Significant studies: 12/23/2020 chest x-ray showed no acute cardiopulmonary disease. 12/25/2018 two 2D echo  Antibiotics: None  Microbiology data: Blood culture:  Procedures: None  Assessment/Plan:   Atrial fibrillation with rapid ventricular response (HCC): -Continue metoprolol transitioning to oral diltiazem. -Continue Xarelto for stroke prophylaxis. -Echo an EF of 30%, global hypokinesia all 4 chambers are dilated, aortic valve no structural abnormalities. -Chads Vascor is 3.  Acute diastolic heart failure: - 6-1/2 L, will hold IV diuresis for now. Basic metabolic panel is pending. He will probably go home off lasix and can switch back to HCTZ.  Chronic kidney disease stage IIIa: No previous creatinine to compare with.  Question baseline the setting of hypertension diabetes mellitus.  Electrolyte imbalance hypokalemia/hypomagnesemia: Basic metabolic panel is pending today.  Diabetes mellitus without complications: Hold all oral hypoglycemic agents.  A1c is 7.3. Continue current regimen long-acting insulin for sliding scale fairly controlled.  Essential hypertension: Blood pressure significantly improved, currently on ARB want to go home on hydrochlorothiazide. Also on diltiazem and metoprolol.  DVT prophylaxis: lovenox Family Communication:Wife Status  is: Inpatient  Remains inpatient appropriate because:Hemodynamically unstable   Dispo: The patient is from: Home              Anticipated d/c is to: Home              Patient currently is not medically stable to d/c.   Difficult to place patient No  Code Status:     Code Status Orders  (From admission, onward)         Start     Ordered   12/23/20 2021  Full code  Continuous        12/23/20 2023        Code Status History    This patient has a current code status but no historical code status.   Advance Care Planning Activity        IV Access:    Peripheral IV   Procedures and diagnostic studies:   DG Chest Portable 1 View  Result Date: 12/23/2020 CLINICAL DATA:  Chest pain and dyspnea EXAM: PORTABLE CHEST 1 VIEW COMPARISON:  None. FINDINGS: Pacer pad overlies the upper left chest. Top-normal heart size. Normal mediastinal contour. No pneumothorax. No pleural effusion. Lungs appear clear, with no acute consolidative airspace disease and no pulmonary edema. IMPRESSION: No active disease. Electronically Signed   By: Delbert PhenixJason A Poff M.D.   On: 12/23/2020 18:52   ECHOCARDIOGRAM COMPLETE  Result Date: 12/24/2020    ECHOCARDIOGRAM REPORT   Patient Name:   George Shaw Date of Exam: 12/24/2020 Medical Rec #:  [id number]        Height:       74.0 in Accession #:    [id number]       Weight:       235.4 lb Date of Birth:  09/29/1971  BSA:          2.329 m Patient Age:    49 years         BP:           102/76 mmHg Patient Gender: M                HR:           104 bpm. Exam Location:  [facility] Procedure: 2D Echo, Color Doppler, Cardiac Doppler and Strain Analysis Indications:     I48.91 Atrial fibrillation; I50.31 CHF-Acute Diastolic  History:         Patient has no prior history of Echocardiogram examinations.                  Risk Factors:Hypertension, Diabetes and HCL.  Sonographer:     Humphrey Rolls RDCS (AE) Referring Phys:  [id number] Vernetta Honey MANSY Diagnosing Phys: [name] MD   Sonographer Comments: Global longitudinal strain was attempted. IMPRESSIONS  1. Left ventricular ejection fraction, by estimation, is 25 to 30%. The left ventricle has severely decreased function. The left ventricle demonstrates global hypokinesis. The left ventricular internal cavity size was mildly dilated. There is mild left ventricular hypertrophy. Left ventricular diastolic parameters are indeterminate. The average left ventricular global longitudinal strain is -7.5 %. The global longitudinal strain is abnormal.  2. Right ventricular systolic function is normal. The right ventricular size is normal. There is normal pulmonary artery systolic pressure.  3. Left atrial size was moderately dilated.  4. Right atrial size was moderately dilated.  5. The mitral valve is normal in structure. Mild mitral valve regurgitation. No evidence of mitral stenosis.  6. The aortic valve is normal in structure. Aortic valve regurgitation is not visualized. No aortic stenosis is present. FINDINGS  Left Ventricle: Left ventricular ejection fraction, by estimation, is 25 to 30%. The left ventricle has severely decreased function. The left ventricle demonstrates global hypokinesis. The average left ventricular global longitudinal strain is -7.5 %. The global longitudinal strain is abnormal. The left ventricular internal cavity size was mildly dilated. There is mild left ventricular hypertrophy. Left ventricular diastolic parameters are indeterminate. Right Ventricle: The right ventricular size is normal. No increase in right ventricular wall thickness. Right ventricular systolic function is normal. There is normal pulmonary artery systolic pressure. The tricuspid regurgitant velocity is 2.38 m/s, and  with an assumed right atrial pressure of 5 mmHg, the estimated right ventricular systolic pressure is 27.7 mmHg. Left Atrium: Left atrial size was moderately dilated. Right Atrium: Right atrial size was moderately dilated. Pericardium:  There is no evidence of pericardial effusion. Mitral Valve: The mitral valve is normal in structure. Mild mitral valve regurgitation. No evidence of mitral valve stenosis. MV peak gradient, 5.3 mmHg. The mean mitral valve gradient is 2.0 mmHg. Tricuspid Valve: The tricuspid valve is normal in structure. Tricuspid valve regurgitation is mild . No evidence of tricuspid stenosis. Aortic Valve: The aortic valve is normal in structure. Aortic valve regurgitation is not visualized. No aortic stenosis is present. Aortic valve mean gradient measures 3.0 mmHg. Aortic valve peak gradient measures 5.5 mmHg. Aortic valve area, by VTI measures 2.88 cm. Pulmonic Valve: The pulmonic valve was normal in structure. Pulmonic valve regurgitation is not visualized. No evidence of pulmonic stenosis. Aorta: The aortic root is normal in size and structure. Venous: The inferior vena cava was not well visualized. IAS/Shunts: No atrial level shunt detected by color flow Doppler.  LEFT VENTRICLE PLAX 2D LVIDd:  5.00 cm      Diastology LVIDs:         4.60 cm      LV e' medial:    7.51 cm/s LV PW:         1.30 cm      LV E/e' medial:  14.3 LV IVS:        1.10 cm      LV e' lateral:   12.20 cm/s LVOT diam:     2.20 cm      LV E/e' lateral: 8.8 LV SV:         54 LV SV Index:   23           2D Longitudinal Strain LVOT Area:     3.80 cm     2D Strain GLS Avg:     -7.5 %  LV Volumes (MOD) LV vol d, MOD A2C: 156.0 ml LV vol d, MOD A4C: 131.0 ml LV vol s, MOD A2C: 105.0 ml LV vol s, MOD A4C: 64.8 ml LV SV MOD A2C:     51.0 ml LV SV MOD A4C:     131.0 ml LV SV MOD BP:      59.7 ml RIGHT VENTRICLE RV Basal diam:  4.00 cm RV S prime:     11.10 cm/s LEFT ATRIUM              Index       RIGHT ATRIUM           Index LA diam:        5.30 cm  2.28 cm/m  RA Area:     24.40 cm LA Vol (A2C):   95.1 ml  40.83 ml/m RA Volume:   79.20 ml  34.00 ml/m LA Vol (A4C):   111.0 ml 47.65 ml/m LA Biplane Vol: 104.0 ml 44.65 ml/m  AORTIC VALVE                    PULMONIC VALVE AV Area (Vmax):    2.89 cm    PV Vmax:       0.81 m/s AV Area (Vmean):   2.69 cm    PV Vmean:      57.500 cm/s AV Area (VTI):     2.88 cm    PV VTI:        0.141 m AV Vmax:           117.00 cm/s PV Peak grad:  2.6 mmHg AV Vmean:          85.500 cm/s PV Mean grad:  1.0 mmHg AV VTI:            0.186 m AV Peak Grad:      5.5 mmHg AV Mean Grad:      3.0 mmHg LVOT Vmax:         88.90 cm/s LVOT Vmean:        60.600 cm/s LVOT VTI:          0.141 m LVOT/AV VTI ratio: 0.76  AORTA Ao Root diam: 3.80 cm MITRAL VALVE                TRICUSPID VALVE MV Area (PHT): 6.74 cm     TR Peak grad:   22.7 mmHg MV Area VTI:   3.50 cm     TR Vmax:        238.00 cm/s MV Peak grad:  5.3 mmHg MV Mean grad:  2.0 mmHg  SHUNTS MV Vmax:       1.15 m/s     Systemic VTI:  0.14 m MV Vmean:      64.4 cm/s    Systemic Diam: 2.20 cm MV Decel Time: 113 msec MV E velocity: 107.50 cm/s [name] MD Electronically signed by [name] MD Signature Date/Time: 12/24/2020/6:33:03 PM    Final      Medical Consultants:    None.   Subjective:    George Shaw patient relates dyspnea and orthopnea have resolved..  Objective:    Vitals:   12/24/20 2200 12/25/20 0000 12/25/20 0200 12/25/20 0400  BP: 115/76 113/90 106/88 111/79  Pulse:      Resp: (!) 27 (!) 26 18 (!) 22  Temp:    98.2 F (36.8 C)  TempSrc:      SpO2:    100%  Weight:    99.9 kg  Height:       SpO2: 100 %   Intake/Output Summary (Last 24 hours) at 12/25/2020 0821 Last data filed at 12/25/2020 0500 Gross per 24 hour  Intake 820 ml  Output 4445 ml  Net -3625 ml   Filed Weights   12/24/20 0000 12/24/20 0358 12/25/20 0400  Weight: 106.8 kg 106.8 kg 99.9 kg    Exam: General exam: In no acute distress. Respiratory system: Good air movement and clear to auscultation. Cardiovascular system: S1 & S2 heard, RRR.  Positive hepatojugular reflux Gastrointestinal system: Abdomen is nondistended, soft and nontender.  Extremities: No  pedal edema. Skin: No rashes, lesions or ulcers Psychiatry: Judgement and insight appear normal. Mood & affect appropriate.   Data Reviewed:    Labs: Basic Metabolic Panel: Recent Labs  Lab 12/23/20 1720 12/23/20 1928 12/24/20 0420  NA 132*  --  134*  K 3.2*  --  3.2*  CL 92*  --  95*  CO2 25  --  25  GLUCOSE 149*  --  148*  BUN 14  --  13  CREATININE 1.65*  --  1.61*  CALCIUM 9.3  --  9.1  MG  --  1.3* 1.9   GFR Estimated Creatinine Clearance: 71.6 mL/min (A) (by C-G formula based on SCr of 1.61 mg/dL (H)). Liver Function Tests: No results for input(s): AST, ALT, ALKPHOS, BILITOT, PROT, ALBUMIN in the last 168 hours. No results for input(s): LIPASE, AMYLASE in the last 168 hours. No results for input(s): AMMONIA in the last 168 hours. Coagulation profile No results for input(s): INR, PROTIME in the last 168 hours. COVID-19 Labs  No results for input(s): DDIMER, FERRITIN, LDH, CRP in the last 72 hours.  Lab Results  Component Value Date   SARSCOV2NAA NEGATIVE 12/23/2020    CBC: Recent Labs  Lab 12/23/20 1720 12/24/20 0420  WBC 6.8 5.4  HGB 15.9 15.4  HCT 46.2 44.0  MCV 87.2 88.2  PLT 172 133*   Cardiac Enzymes: No results for input(s): CKTOTAL, CKMB, CKMBINDEX, TROPONINI in the last 168 hours. BNP (last 3 results) No results for input(s): PROBNP in the last 8760 hours. CBG: Recent Labs  Lab 12/23/20 2056 12/24/20 0735 12/24/20 1138 12/24/20 1629 12/24/20 2030  GLUCAP 151* 149* 172* 176* 177*   D-Dimer: No results for input(s): DDIMER in the last 72 hours. Hgb A1c: Recent Labs    12/24/20 0420  HGBA1C 7.3*   Lipid Profile: No results for input(s): CHOL, HDL, LDLCALC, TRIG, CHOLHDL, LDLDIRECT in the last 72 hours. Thyroid function studies: No results for input(s): TSH, T4TOTAL, T3FREE, THYROIDAB  in the last 72 hours.  Invalid input(s): FREET3 Anemia work up: No results for input(s): VITAMINB12, FOLATE, FERRITIN, TIBC, IRON, RETICCTPCT in  the last 72 hours. Sepsis Labs: Recent Labs  Lab 12/23/20 1720 12/24/20 0420  WBC 6.8 5.4   Microbiology Recent Results (from the past 240 hour(s))  SARS CORONAVIRUS 2 (TAT 6-24 HRS) Nasopharyngeal Nasopharyngeal Swab     Status: None   Collection Time: 12/23/20  7:24 PM   Specimen: Nasopharyngeal Swab  Result Value Ref Range Status   SARS Coronavirus 2 NEGATIVE NEGATIVE Final    Comment: (NOTE) SARS-CoV-2 target nucleic acids are NOT DETECTED.  The SARS-CoV-2 RNA is generally detectable in upper and lower respiratory specimens during the acute phase of infection. Negative results do not preclude SARS-CoV-2 infection, do not rule out co-infections with other pathogens, and should not be used as the sole basis for treatment or other patient management decisions. Negative results must be combined with clinical observations, patient history, and epidemiological information. The expected result is Negative.  Fact Sheet for Patients: HairSlick.no  Fact Sheet for Healthcare Providers: quierodirigir.com  This test is not yet approved or cleared by the [place] FDA and  has been authorized for detection and/or diagnosis of SARS-CoV-2 by FDA under an Emergency Use Authorization (EUA). This EUA will remain  in effect (meaning this test can be used) for the duration of the COVID-19 declaration under Se ction 564(b)(1) of the Act, 21 U.S.C. section 360bbb-3(b)(1), unless the authorization is terminated or revoked sooner.  Performed at [facility], [address] [place], [place] [address]      Medications:    apixaban  5 mg Oral BID   furosemide  40 mg Intravenous Q12H   insulin aspart  0-5 Units Subcutaneous QHS   insulin aspart  0-9 Units Subcutaneous TID WC   insulin aspart  3 Units Subcutaneous TID WC   linagliptin  5 mg Oral Daily   losartan  50 mg Oral Daily   metoprolol tartrate  25 mg Oral  BID   rosuvastatin  10 mg Oral Daily   sodium chloride flush  3 mL Intravenous Q12H   Continuous Infusions:  sodium chloride     diltiazem (CARDIZEM) infusion 15 mg/hr (12/25/20 0008)      LOS: 2 days   Marinda Elk  [facility]  12/25/2020, 8:21 AM

## 2020-12-25 NOTE — Consult Note (Addendum)
   Heart Failure Nurse Navigator Note  HFrEF 25 to 30%.  Mild LVH.  Normal right ventricular systolic function.  Normal pulmonary artery systolic pressures.  Moderate biatrial enlargement.   He presented with increasing shortness of breath, dyspnea on exertion, lower extremity edema, orthopnea and PND.  He had chest discomfort which he described as a tightness along with palpitations  Comorbidities:  Hypertension Hyperlipidemia Type 2 diabetes.  Medications:  Eliquis 5 mg 2 times a day diltiazem 60 mg every 6 hours by mouth Furosemide 40 mg IV once today Losartan 50 mg daily Metoprolol tartrate 25 mg 2 times a day Crestor 10 mg  Labs:  Sodium 130, potassium 3.9, chloride 94, CO2 24, BUN 18, creatinine 1.64 Weight is 99.9 kg BMI is 28  Blood pressure 101/67   Met with patient today, parents were at the bedside.  States that the staff nurses had already started to do his heart failure teaching only had received the heart failure teaching booklet along with his own magnet.  By teach back method we discussed weights daily and what to report, dietary and fluid restrictions.  Discussed the heart failure clinic with him and his upcoming appointment, he was given information about the heart failure clinic.   Pricilla Riffle RN CHFN

## 2020-12-25 NOTE — Progress Notes (Signed)
[facility] Cardiology  SUBJECTIVE: Patient laying in bed, reports feeling better, denies chest pain, shortness of breath or palpitations   Vitals:   12/24/20 2200 12/25/20 0000 12/25/20 0200 12/25/20 0400  BP: 115/76 113/90 106/88 111/79  Pulse:      Resp: (!) 27 (!) 26 18 (!) 22  Temp:    98.2 F (36.8 C)  TempSrc:      SpO2:    100%  Weight:    99.9 kg  Height:         Intake/Output Summary (Last 24 hours) at 12/25/2020 0816 Last data filed at 12/25/2020 0500 Gross per 24 hour  Intake 820 ml  Output 4445 ml  Net -3625 ml      PHYSICAL EXAM  General: Well developed, well nourished, in no acute distress HEENT:  Normocephalic and atramatic Neck:  No JVD.  Lungs: Clear bilaterally to auscultation and percussion. Heart: HRRR . Normal S1 and S2 without gallops or murmurs.  Abdomen: Bowel sounds are positive, abdomen soft and non-tender  Msk:  Back normal, normal gait. Normal strength and tone for age. Extremities: No clubbing, cyanosis or edema.   Neuro: Alert and oriented X 3. Psych:  Good affect, responds appropriately   LABS: Basic Metabolic Panel: Recent Labs    12/23/20 1720 12/23/20 1928 12/24/20 0420  NA 132*  --  134*  K 3.2*  --  3.2*  CL 92*  --  95*  CO2 25  --  25  GLUCOSE 149*  --  148*  BUN 14  --  13  CREATININE 1.65*  --  1.61*  CALCIUM 9.3  --  9.1  MG  --  1.3* 1.9   Liver Function Tests: No results for input(s): AST, ALT, ALKPHOS, BILITOT, PROT, ALBUMIN in the last 72 hours. No results for input(s): LIPASE, AMYLASE in the last 72 hours. CBC: Recent Labs    12/23/20 1720 12/24/20 0420  WBC 6.8 5.4  HGB 15.9 15.4  HCT 46.2 44.0  MCV 87.2 88.2  PLT 172 133*   Cardiac Enzymes: No results for input(s): CKTOTAL, CKMB, CKMBINDEX, TROPONINI in the last 72 hours. BNP: Invalid input(s): POCBNP D-Dimer: No results for input(s): DDIMER in the last 72 hours. Hemoglobin A1C: Recent Labs    12/24/20 0420  HGBA1C 7.3*   Fasting Lipid  Panel: No results for input(s): CHOL, HDL, LDLCALC, TRIG, CHOLHDL, LDLDIRECT in the last 72 hours. Thyroid Function Tests: No results for input(s): TSH, T4TOTAL, T3FREE, THYROIDAB in the last 72 hours.  Invalid input(s): FREET3 Anemia Panel: No results for input(s): VITAMINB12, FOLATE, FERRITIN, TIBC, IRON, RETICCTPCT in the last 72 hours.  DG Chest Portable 1 View  Result Date: 12/23/2020 CLINICAL DATA:  Chest pain and dyspnea EXAM: PORTABLE CHEST 1 VIEW COMPARISON:  None. FINDINGS: Pacer pad overlies the upper left chest. Top-normal heart size. Normal mediastinal contour. No pneumothorax. No pleural effusion. Lungs appear clear, with no acute consolidative airspace disease and no pulmonary edema. IMPRESSION: No active disease. Electronically Signed   By: Delbert Phenix M.D.   On: 12/23/2020 18:52   ECHOCARDIOGRAM COMPLETE  Result Date: 12/24/2020    ECHOCARDIOGRAM REPORT   Patient Name:   George Shaw Date of Exam: 12/24/2020 Medical Rec #:  [id number]        Height:       74.0 in Accession #:    [id number]       Weight:       235.4 lb Date of Birth:  September 29, 1971  BSA:          2.329 m Patient Age:    49 years         BP:           102/76 mmHg Patient Gender: M                HR:           104 bpm. Exam Location:  [facility] Procedure: 2D Echo, Color Doppler, Cardiac Doppler and Strain Analysis Indications:     I48.91 Atrial fibrillation; I50.31 CHF-Acute Diastolic  History:         Patient has no prior history of Echocardiogram examinations.                  Risk Factors:Hypertension, Diabetes and HCL.  Sonographer:     Humphrey Rolls RDCS (AE) Referring Phys:  [id number] Vernetta Honey MANSY Diagnosing Phys: [name] MD  Sonographer Comments: Global longitudinal strain was attempted. IMPRESSIONS  1. Left ventricular ejection fraction, by estimation, is 25 to 30%. The left ventricle has severely decreased function. The left ventricle demonstrates global hypokinesis. The left ventricular internal cavity size  was mildly dilated. There is mild left ventricular hypertrophy. Left ventricular diastolic parameters are indeterminate. The average left ventricular global longitudinal strain is -7.5 %. The global longitudinal strain is abnormal.  2. Right ventricular systolic function is normal. The right ventricular size is normal. There is normal pulmonary artery systolic pressure.  3. Left atrial size was moderately dilated.  4. Right atrial size was moderately dilated.  5. The mitral valve is normal in structure. Mild mitral valve regurgitation. No evidence of mitral stenosis.  6. The aortic valve is normal in structure. Aortic valve regurgitation is not visualized. No aortic stenosis is present. FINDINGS  Left Ventricle: Left ventricular ejection fraction, by estimation, is 25 to 30%. The left ventricle has severely decreased function. The left ventricle demonstrates global hypokinesis. The average left ventricular global longitudinal strain is -7.5 %. The global longitudinal strain is abnormal. The left ventricular internal cavity size was mildly dilated. There is mild left ventricular hypertrophy. Left ventricular diastolic parameters are indeterminate. Right Ventricle: The right ventricular size is normal. No increase in right ventricular wall thickness. Right ventricular systolic function is normal. There is normal pulmonary artery systolic pressure. The tricuspid regurgitant velocity is 2.38 m/s, and  with an assumed right atrial pressure of 5 mmHg, the estimated right ventricular systolic pressure is 27.7 mmHg. Left Atrium: Left atrial size was moderately dilated. Right Atrium: Right atrial size was moderately dilated. Pericardium: There is no evidence of pericardial effusion. Mitral Valve: The mitral valve is normal in structure. Mild mitral valve regurgitation. No evidence of mitral valve stenosis. MV peak gradient, 5.3 mmHg. The mean mitral valve gradient is 2.0 mmHg. Tricuspid Valve: The tricuspid valve is normal  in structure. Tricuspid valve regurgitation is mild . No evidence of tricuspid stenosis. Aortic Valve: The aortic valve is normal in structure. Aortic valve regurgitation is not visualized. No aortic stenosis is present. Aortic valve mean gradient measures 3.0 mmHg. Aortic valve peak gradient measures 5.5 mmHg. Aortic valve area, by VTI measures 2.88 cm. Pulmonic Valve: The pulmonic valve was normal in structure. Pulmonic valve regurgitation is not visualized. No evidence of pulmonic stenosis. Aorta: The aortic root is normal in size and structure. Venous: The inferior vena cava was not well visualized. IAS/Shunts: No atrial level shunt detected by color flow Doppler.  LEFT VENTRICLE PLAX 2D LVIDd:  5.00 cm      Diastology LVIDs:         4.60 cm      LV e' medial:    7.51 cm/s LV PW:         1.30 cm      LV E/e' medial:  14.3 LV IVS:        1.10 cm      LV e' lateral:   12.20 cm/s LVOT diam:     2.20 cm      LV E/e' lateral: 8.8 LV SV:         54 LV SV Index:   23           2D Longitudinal Strain LVOT Area:     3.80 cm     2D Strain GLS Avg:     -7.5 %  LV Volumes (MOD) LV vol d, MOD A2C: 156.0 ml LV vol d, MOD A4C: 131.0 ml LV vol s, MOD A2C: 105.0 ml LV vol s, MOD A4C: 64.8 ml LV SV MOD A2C:     51.0 ml LV SV MOD A4C:     131.0 ml LV SV MOD BP:      59.7 ml RIGHT VENTRICLE RV Basal diam:  4.00 cm RV S prime:     11.10 cm/s LEFT ATRIUM              Index       RIGHT ATRIUM           Index LA diam:        5.30 cm  2.28 cm/m  RA Area:     24.40 cm LA Vol (A2C):   95.1 ml  40.83 ml/m RA Volume:   79.20 ml  34.00 ml/m LA Vol (A4C):   111.0 ml 47.65 ml/m LA Biplane Vol: 104.0 ml 44.65 ml/m  AORTIC VALVE                   PULMONIC VALVE AV Area (Vmax):    2.89 cm    PV Vmax:       0.81 m/s AV Area (Vmean):   2.69 cm    PV Vmean:      57.500 cm/s AV Area (VTI):     2.88 cm    PV VTI:        0.141 m AV Vmax:           117.00 cm/s PV Peak grad:  2.6 mmHg AV Vmean:          85.500 cm/s PV Mean grad:  1.0  mmHg AV VTI:            0.186 m AV Peak Grad:      5.5 mmHg AV Mean Grad:      3.0 mmHg LVOT Vmax:         88.90 cm/s LVOT Vmean:        60.600 cm/s LVOT VTI:          0.141 m LVOT/AV VTI ratio: 0.76  AORTA Ao Root diam: 3.80 cm MITRAL VALVE                TRICUSPID VALVE MV Area (PHT): 6.74 cm     TR Peak grad:   22.7 mmHg MV Area VTI:   3.50 cm     TR Vmax:        238.00 cm/s MV Peak grad:  5.3 mmHg MV Mean grad:  2.0 mmHg  SHUNTS MV Vmax:       1.15 m/s     Systemic VTI:  0.14 m MV Vmean:      64.4 cm/s    Systemic Diam: 2.20 cm MV Decel Time: 113 msec MV E velocity: 107.50 cm/s [name] MD Electronically signed by [name] MD Signature Date/Time: 12/24/2020/6:33:03 PM    Final      Echo LVEF 25 to 30%, echo performed while patient was in atrial fibrillation with rapid ventricular rate  TELEMETRY: Atrial fibrillation at 100 bpm:  ASSESSMENT AND PLAN:  Active Problems:   Atrial fibrillation with rapid ventricular response (HCC)   Acute diastolic CHF (congestive heart failure) (HCC)   Diabetes mellitus type 2, controlled, without complications (HCC)   Hypokalemia   CKD (chronic kidney disease), stage III (HCC)    1. New onset atrial fibrillation with rapid ventricular rate, with a chads vasc score of 3 (HTN, T2DM, CHF), with a history of alcohol use (drinks several beers 3x/week), and possible OSA. Patient remains in atrial fibrillation with improved rate control on diltiazem drip and metoprolol tartrate 25 mg twice daily. 2. Acute systolic CHF, LVEF 25 to 30% by 2D echocardiogram which was performed while the patient was in atrial fibrillation with rapid ventricular rate.  Patient reports a 2-week history of peripheral edema, exertional dyspnea, orthopnea, and constant chest heaviness with and without exertion. Chest xray negative for pulmonary edema. BNP mildly elevated to 271, high sensitivity troponin mildly elevated and down-trending (36 ->34).  Clinical improvement on IV  furosemide. 3. Hypertension, low this morning.  4. Type II diabetes 5. Hyperlipidemia, on Crestor  Recommendations  1.  Agree with overall current therapy 2.  Continue diuresis 3.  Carefully monitor renal status 4.  Transition diltiazem drip to p.o. diltiazem 5.  Continue metoprolol tartrate 25 mg twice daily, uptitrate as BP permits 6.  Continue Eliquis for stroke prevention 7.  Limit alcohol consumption 8.  Consider discharge home later today after transition to p.o. diltiazem 9.  Follow-up as outpatient, likely repeat 2D echocardiogram once patient back in sinus rhythm   Marcina Millard, MD, PhD, [facility] 12/25/2020 8:16 AM

## 2020-12-26 DIAGNOSIS — I4891 Unspecified atrial fibrillation: Secondary | ICD-10-CM

## 2020-12-26 LAB — GLUCOSE, CAPILLARY
Glucose-Capillary: 176 mg/dL — ABNORMAL HIGH (ref 70–99)
Glucose-Capillary: 152 mg/dL — ABNORMAL HIGH (ref 70–99)
Glucose-Capillary: 194 mg/dL — ABNORMAL HIGH (ref 70–99)
Glucose-Capillary: 191 mg/dL — ABNORMAL HIGH (ref 70–99)

## 2020-12-26 LAB — BASIC METABOLIC PANEL
Anion gap: 10 (ref 5–15)
BUN: 20 mg/dL (ref 6–20)
CO2: 26 mmol/L (ref 22–32)
Calcium: 8.7 mg/dL — ABNORMAL LOW (ref 8.9–10.3)
Chloride: 97 mmol/L — ABNORMAL LOW (ref 98–111)
Creatinine, Ser: 1.47 mg/dL — ABNORMAL HIGH (ref 0.61–1.24)
GFR, Estimated: 59 mL/min — ABNORMAL LOW (ref >60)
Glucose, Bld: 160 mg/dL — ABNORMAL HIGH (ref 70–99)
Potassium: 3.9 mmol/L (ref 3.5–5.1)
Sodium: 133 mmol/L — ABNORMAL LOW (ref 135–145)

## 2020-12-26 MED ORDER — METOPROLOL TARTRATE 50 MG PO TABS: 50.0000 mg | ORAL_TABLET | Freq: Two times a day (BID) | ORAL | Status: DC

## 2020-12-26 MED ORDER — METOPROLOL TARTRATE 25 MG PO TABS
25.0000 mg | ORAL_TABLET | Freq: Once | ORAL | Status: AC
  Administered 2020-12-26: 25 mg via ORAL
  Filled 2020-12-26: qty 1

## 2020-12-26 MED ORDER — METOPROLOL TARTRATE 50 MG PO TABS
75.0000 mg | ORAL_TABLET | Freq: Two times a day (BID) | ORAL | Status: DC
  Administered 2020-12-26: 75 mg via ORAL
  Filled 2020-12-26: qty 1

## 2020-12-26 MED ORDER — DILTIAZEM HCL ER COATED BEADS 180 MG PO CP24
360.0000 mg | ORAL_CAPSULE | Freq: Every day | ORAL | Status: DC
  Administered 2020-12-26 – 2020-12-27 (×2): 360 mg via ORAL
  Filled 2020-12-26 (×2): qty 2

## 2020-12-26 NOTE — Assessment & Plan Note (Signed)
-  A1c 7.3% -Continue SSI and CBG monitoring

## 2020-12-26 NOTE — Assessment & Plan Note (Signed)
-  Replete and recheck as needed 

## 2020-12-26 NOTE — Progress Notes (Signed)
PROGRESS NOTE    George Shaw   NFA:[id number]  DOB: 1971/08/24  DOA: 12/23/2020     3  PCP: George Arthurs, NP  CC: SOB  Hospital Course: George Shaw is a 50 yo male with PMH alcohol use, DMII, HTN who presented to the ER with lower extremity edema, and worsening shortness of breath at home.  On work-up he was found to be in A. fib with RVR. He was also evaluated by cardiology during hospitalization.  He was started on anticoagulation and rate control with short acting diltiazem initially.  This was transitioned to extended release and up titration of metoprolol was also continued during hospitalization. He underwent echo which showed EF 25 to 30% with global hypokinesis.  Cardiology recommended to continue with current regimen in place.  He was also on losartan during hospitalization however blood pressure could not tolerate and this was discontinued.  If blood pressure amenable at hospital follow-up, this could be resumed if able.   Interval History:  No events overnight.  Denies any chest pain or shortness of breath.  Heart rate still remained elevated this morning.  Lopressor was increased.  Due to ongoing elevated heart rate this afternoon, he was recommended to remain in the hospital for further titration of medications and possible discharge home tomorrow.  ROS: Constitutional: negative for chills and fevers, Respiratory: negative for cough, Cardiovascular: negative for chest pain and Gastrointestinal: negative for abdominal pain  Assessment & Plan: * Atrial fibrillation with rapid ventricular response (HCC) -New diagnosis -Followed by cardiology during hospitalization -Cardizem has been transitioned to CD formulation.  Monitor vitals response -Losartan discontinued due to hypotension -Continue Lopressor and adjust as needed.  Still not adequately rate controlled this morning.  Dose increased to 75 mg twice daily  Acute systolic CHF (congestive heart failure) (HCC) - BP  cannot tolerate losartan at this time, has been d/c for now -EF 25 to 30% with global hypokinesis - continue Eliquis, lopressor, cardizem per cardiology - outpatient follow up for repeat echo and possible cardioversion if/when indicated  Chronic renal failure, stage 3a ([place]) - patient has history of CKD3a. Baseline creat ~ 1.6, eGFR 50-53  Hypokalemia -Replete and recheck as needed  Diabetes mellitus type 2, controlled, without complications (HCC) -I6N 7.3% -Continue SSI and CBG monitoring    Old records reviewed in assessment of this patient  Antimicrobials:   DVT prophylaxis:  apixaban (ELIQUIS) tablet 5 mg   Code Status:   Code Status: Full Code Family Communication:   Disposition Plan: Status is: Inpatient  Remains inpatient appropriate because:Inpatient level of care appropriate due to severity of illness   Dispo: The patient is from: Home              Anticipated d/c is to: Home              Patient currently is not medically stable to d/c.   Difficult to place patient No  Risk of unplanned readmission score: Unplanned Admission- Pilot do not use: 13.34   Objective: Blood pressure (!) 86/73, pulse 73, temperature 97.9 F (36.6 C), temperature source Oral, resp. rate (!) 21, height $RemoveBe'6\' 2"'TqMOhrvxx$  (1.88 m), weight 99.9 kg, SpO2 100 %.  Examination: General appearance: alert, cooperative and no distress Head: Normocephalic, without obvious abnormality, atraumatic Eyes: EOMI Lungs: clear to auscultation bilaterally Heart: irregularly irregular rhythm and S1, S2 normal Abdomen: normal findings: bowel sounds normal and soft, non-tender Extremities: no edema Skin: mobility and turgor normal Neurologic: Grossly normal  Consultants:  Cardiology   Procedures:     Data Reviewed: I have personally reviewed following labs and imaging studies Results for orders placed or performed during the hospital encounter of 12/23/20 (from the past 24 hour(s))  Glucose, capillary      Status: Abnormal   Collection Time: 12/25/20  4:56 PM  Result Value Ref Range   Glucose-Capillary 189 (H) 70 - 99 mg/dL   Comment 1 Notify RN    Comment 2 Document in Chart   Glucose, capillary     Status: Abnormal   Collection Time: 12/25/20  8:59 PM  Result Value Ref Range   Glucose-Capillary 216 (H) 70 - 99 mg/dL  Basic metabolic panel     Status: Abnormal   Collection Time: 12/26/20  6:38 AM  Result Value Ref Range   Sodium 133 (L) 135 - 145 mmol/L   Potassium 3.9 3.5 - 5.1 mmol/L   Chloride 97 (L) 98 - 111 mmol/L   CO2 26 22 - 32 mmol/L   Glucose, Bld 160 (H) 70 - 99 mg/dL   BUN 20 6 - 20 mg/dL   Creatinine, Ser 1.47 (H) 0.61 - 1.24 mg/dL   Calcium 8.7 (L) 8.9 - 10.3 mg/dL   GFR, Estimated 59 (L) >60 mL/min   Anion gap 10 5 - 15  Glucose, capillary     Status: Abnormal   Collection Time: 12/26/20  7:42 AM  Result Value Ref Range   Glucose-Capillary 176 (H) 70 - 99 mg/dL  Glucose, capillary     Status: Abnormal   Collection Time: 12/26/20 11:03 AM  Result Value Ref Range   Glucose-Capillary 152 (H) 70 - 99 mg/dL  Glucose, capillary     Status: Abnormal   Collection Time: 12/26/20  3:48 PM  Result Value Ref Range   Glucose-Capillary 194 (H) 70 - 99 mg/dL    Recent Results (from the past 240 hour(s))  SARS CORONAVIRUS 2 (TAT 6-24 HRS) Nasopharyngeal Nasopharyngeal Swab     Status: None   Collection Time: 12/23/20  7:24 PM   Specimen: Nasopharyngeal Swab  Result Value Ref Range Status   SARS Coronavirus 2 NEGATIVE NEGATIVE Final    Comment: (NOTE) SARS-CoV-2 target nucleic acids are NOT DETECTED.  The SARS-CoV-2 RNA is generally detectable in upper and lower respiratory specimens during the acute phase of infection. Negative results do not preclude SARS-CoV-2 infection, do not rule out co-infections with other pathogens, and should not be used as the sole basis for treatment or other patient management decisions. Negative results must be combined with clinical  observations, patient history, and epidemiological information. The expected result is Negative.  Fact Sheet for Patients: SugarRoll.be  Fact Sheet for Healthcare Providers: https://www.woods-mathews.com/  This test is not yet approved or cleared by the [place] FDA and  has been authorized for detection and/or diagnosis of SARS-CoV-2 by FDA under an Emergency Use Authorization (EUA). This EUA will remain  in effect (meaning this test can be used) for the duration of the COVID-19 declaration under Se ction 564(b)(1) of the Act, 21 U.S.C. section 360bbb-3(b)(1), unless the authorization is terminated or revoked sooner.  Performed at [facility], [place] [address] [place], [place] [address]      Radiology Studies: No results found. DG Chest Portable 1 View  Final Result      Scheduled Meds: . apixaban  5 mg Oral BID  . diltiazem  360 mg Oral Daily  . insulin aspart  0-5 Units Subcutaneous QHS  .  insulin aspart  0-9 Units Subcutaneous TID WC  . insulin aspart  3 Units Subcutaneous TID WC  . linagliptin  5 mg Oral Daily  . metoprolol tartrate  75 mg Oral BID  . rosuvastatin  10 mg Oral Daily  . sodium chloride flush  3 mL Intravenous Q12H   PRN Meds: sodium chloride, acetaminophen **OR** acetaminophen, calcium carbonate, magnesium hydroxide, metoprolol tartrate, ondansetron **OR** ondansetron (ZOFRAN) IV, sodium chloride flush, traMADol, traZODone Continuous Infusions: . sodium chloride       LOS: 3 days  Time spent: Greater than 50% of the 35 minute visit was spent in counseling/coordination of care for the patient as laid out in the A&P.   Dwyane Dee, MD [facility] 12/26/2020, 4:26 PM

## 2020-12-26 NOTE — Assessment & Plan Note (Addendum)
-  New diagnosis -Followed by cardiology during hospitalization - started on Eliquis for anticoagulation  -Cardizem has been transitioned to CD formulation.  Monitor vitals response -Losartan discontinued due to hypotension -Continue Lopressor and adjust as needed.  Was increased up to 100 mg twice daily prior to discharge -Follow-up rate control at outpatient visit: Meds at discharge: Cardizem CD 360 mg daily, Lopressor 100 mg twice daily

## 2020-12-26 NOTE — Progress Notes (Signed)
   HFrEF 25 to 30%.  Mild LVH.  Normal right ventricular systolic function.  Normal pulmonary artery systolic pressures.  Moderate biatrial enlargement.  He presented with increasing shortness of breath, dyspnea on exertion, lower extremity edema, orthopnea and PND.  He also had chest discomfort which he described as a tightness along with palpitations.  Comorbidities:  Hypertension Hyperlipidemia Type 2 diabetes   Medications:  Apixaban 5 mg 2 times a day Diltiazem 360 mg daily Rosuvastatin 10 mg daily  Metoprolol tartrate 50 mg 2 times a day has been discontinued only he will start 75 mg 2 times a day tomorrow.  Labs:  Sodium 133, potassium 3.9, chloride 97, CO2 26, BUN 20, creatinine 1.47 Intake 843 mL Output 2300 mL Weight 99.9 kg Blood pressure 99/66    Assessment:  General-he is awake and alert lying in bed in no acute distress.  HEENT-pupils are equal, normocephalic.  Cardiac-heart tones of regular rate and rhythm no murmurs rubs appreciated.  Chest-breath sounds are clear to posterior auscultation  Abdomen-soft nontender  Musculoskeletal-lower extremities there is no edema pedal pulses are palpable.  Psych-is pleasant and appropriate makes good eye contact.  Neurologic-speech is clear moves all extremities without difficulty.   Met with patient today and discussed his medications and how they work.  He voices understanding.  Also discussed the importance of checking his blood pressure, he states that he does it 3 times a day at home reminded to sit and rest 10 to 15 minutes before he checks his blood pressure.  Discussed his appointment with the outpatient heart failure clinic.  We will continue to follow along.   Pricilla Riffle RN CHFN

## 2020-12-26 NOTE — Hospital Course (Signed)
George Shaw is a 50 yo male with PMH alcohol use, DMII, HTN who presented to the ER with lower extremity edema, and worsening shortness of breath at home.  On work-up he was found to be in A. fib with RVR. He was also evaluated by cardiology during hospitalization.  He was started on anticoagulation and rate control with short acting diltiazem initially.  This was transitioned to extended release and up titration of metoprolol was also continued during hospitalization. He underwent echo which showed EF 25 to 30% with global hypokinesis.  Cardiology recommended to continue with current regimen in place.  He was also on losartan during hospitalization however blood pressure could not tolerate and this was discontinued.  If blood pressure amenable at hospital follow-up, this could be resumed if able.

## 2020-12-26 NOTE — TOC Progression Note (Signed)
Transition of Care [facility]) - Progression Note    Patient Details  Name: Kirt Chew MRN: [id number] Date of Birth: 1971/04/25  Transition of Care [facility]) CM/SW Contact  Hetty Ely, RN Phone Number: 12/26/2020, 11:45 AM  Clinical Narrative: Spoke with patient, wife and son at bedside. Patient states he still drive, able to get medications at [facility] in [place]. Lives with wife, they both cook and shop. No DME or HH services needed.           Expected Discharge Plan and Services                                                 Social Determinants of Health (SDOH) Interventions    Readmission Risk Interventions No flowsheet data found.

## 2020-12-26 NOTE — Assessment & Plan Note (Signed)
-  patient has history of CKD3a. Baseline creat ~ 1.6, eGFR 50-53

## 2020-12-26 NOTE — Assessment & Plan Note (Addendum)
-   BP cannot tolerate losartan at this time, has been d/c for now -EF 25 to 30% with global hypokinesis - continue Eliquis, lopressor, cardizem per cardiology - outpatient follow up for repeat echo and possible cardioversion if/when indicated

## 2020-12-26 NOTE — Progress Notes (Signed)
[facility] Cardiology  SUBJECTIVE: Patient sitting in bed, eating breakfast, had episode of left-sided hest wall pain, relieved with tramadol   Vitals:   12/25/20 2122 12/25/20 2300 12/26/20 0522 12/26/20 0737  BP: 112/68 113/83 (!) 111/98 109/82  Pulse:    (!) 113  Resp: 20   (!) 22  Temp: 97.6 F (36.4 C)  97.6 F (36.4 C) (!) 97.5 F (36.4 C)  TempSrc: Oral  Oral Oral  SpO2:    99%  Weight:      Height:         Intake/Output Summary (Last 24 hours) at 12/26/2020 0818 Last data filed at 12/25/2020 1931 Gross per 24 hour  Intake 843 ml  Output 2300 ml  Net -1457 ml      PHYSICAL EXAM  General: Well developed, well nourished, in no acute distress HEENT:  Normocephalic and atramatic Neck:  No JVD.  Lungs: Clear bilaterally to auscultation and percussion. Heart: HRRR . Normal S1 and S2 without gallops or murmurs.  Abdomen: Bowel sounds are positive, abdomen soft and non-tender  Msk:  Back normal, normal gait. Normal strength and tone for age. Extremities: No clubbing, cyanosis or edema.   Neuro: Alert and oriented X 3. Psych:  Good affect, responds appropriately   LABS: Basic Metabolic Panel: Recent Labs    12/23/20 1928 12/24/20 0420 12/25/20 0904 12/26/20 0638  NA  --  134* 130* 133*  K  --  3.2* 3.9 3.9  CL  --  95* 94* 97*  CO2  --  25 24 26   GLUCOSE  --  148* 206* 160*  BUN  --  13 18 20   CREATININE  --  1.61* 1.64* 1.47*  CALCIUM  --  9.1 8.9 8.7*  MG 1.3* 1.9  --   --    Liver Function Tests: No results for input(s): AST, ALT, ALKPHOS, BILITOT, PROT, ALBUMIN in the last 72 hours. No results for input(s): LIPASE, AMYLASE in the last 72 hours. CBC: Recent Labs    12/23/20 1720 12/24/20 0420  WBC 6.8 5.4  HGB 15.9 15.4  HCT 46.2 44.0  MCV 87.2 88.2  PLT 172 133*   Cardiac Enzymes: No results for input(s): CKTOTAL, CKMB, CKMBINDEX, TROPONINI in the last 72 hours. BNP: Invalid input(s): POCBNP D-Dimer: No results for input(s): DDIMER in the last  72 hours. Hemoglobin A1C: Recent Labs    12/24/20 0420  HGBA1C 7.3*   Fasting Lipid Panel: No results for input(s): CHOL, HDL, LDLCALC, TRIG, CHOLHDL, LDLDIRECT in the last 72 hours. Thyroid Function Tests: No results for input(s): TSH, T4TOTAL, T3FREE, THYROIDAB in the last 72 hours.  Invalid input(s): FREET3 Anemia Panel: No results for input(s): VITAMINB12, FOLATE, FERRITIN, TIBC, IRON, RETICCTPCT in the last 72 hours.  ECHOCARDIOGRAM COMPLETE  Result Date: 12/24/2020    ECHOCARDIOGRAM REPORT   Patient Name:   George Shaw Date of Exam: 12/24/2020 Medical Rec #:  Cecil Cobbs        Height:       74.0 in Accession #:    12/26/2020       Weight:       235.4 lb Date of Birth:  09-29-1971        BSA:          2.329 m Patient Age:    49 years         BP:           102/76 mmHg Patient Gender: M  HR:           104 bpm. Exam Location:  [facility] Procedure: 2D Echo, Color Doppler, Cardiac Doppler and Strain Analysis Indications:     I48.91 Atrial fibrillation; I50.31 CHF-Acute Diastolic  History:         Patient has no prior history of Echocardiogram examinations.                  Risk Factors:Hypertension, Diabetes and HCL.  Sonographer:     Humphrey Rolls RDCS (AE) Referring Phys:  [id number] Vernetta Honey MANSY Diagnosing Phys: [name] MD  Sonographer Comments: Global longitudinal strain was attempted. IMPRESSIONS  1. Left ventricular ejection fraction, by estimation, is 25 to 30%. The left ventricle has severely decreased function. The left ventricle demonstrates global hypokinesis. The left ventricular internal cavity size was mildly dilated. There is mild left ventricular hypertrophy. Left ventricular diastolic parameters are indeterminate. The average left ventricular global longitudinal strain is -7.5 %. The global longitudinal strain is abnormal.  2. Right ventricular systolic function is normal. The right ventricular size is normal. There is normal pulmonary artery systolic pressure.  3. Left  atrial size was moderately dilated.  4. Right atrial size was moderately dilated.  5. The mitral valve is normal in structure. Mild mitral valve regurgitation. No evidence of mitral stenosis.  6. The aortic valve is normal in structure. Aortic valve regurgitation is not visualized. No aortic stenosis is present. FINDINGS  Left Ventricle: Left ventricular ejection fraction, by estimation, is 25 to 30%. The left ventricle has severely decreased function. The left ventricle demonstrates global hypokinesis. The average left ventricular global longitudinal strain is -7.5 %. The global longitudinal strain is abnormal. The left ventricular internal cavity size was mildly dilated. There is mild left ventricular hypertrophy. Left ventricular diastolic parameters are indeterminate. Right Ventricle: The right ventricular size is normal. No increase in right ventricular wall thickness. Right ventricular systolic function is normal. There is normal pulmonary artery systolic pressure. The tricuspid regurgitant velocity is 2.38 m/s, and  with an assumed right atrial pressure of 5 mmHg, the estimated right ventricular systolic pressure is 27.7 mmHg. Left Atrium: Left atrial size was moderately dilated. Right Atrium: Right atrial size was moderately dilated. Pericardium: There is no evidence of pericardial effusion. Mitral Valve: The mitral valve is normal in structure. Mild mitral valve regurgitation. No evidence of mitral valve stenosis. MV peak gradient, 5.3 mmHg. The mean mitral valve gradient is 2.0 mmHg. Tricuspid Valve: The tricuspid valve is normal in structure. Tricuspid valve regurgitation is mild . No evidence of tricuspid stenosis. Aortic Valve: The aortic valve is normal in structure. Aortic valve regurgitation is not visualized. No aortic stenosis is present. Aortic valve mean gradient measures 3.0 mmHg. Aortic valve peak gradient measures 5.5 mmHg. Aortic valve area, by VTI measures 2.88 cm. Pulmonic Valve: The  pulmonic valve was normal in structure. Pulmonic valve regurgitation is not visualized. No evidence of pulmonic stenosis. Aorta: The aortic root is normal in size and structure. Venous: The inferior vena cava was not well visualized. IAS/Shunts: No atrial level shunt detected by color flow Doppler.  LEFT VENTRICLE PLAX 2D LVIDd:         5.00 cm      Diastology LVIDs:         4.60 cm      LV e' medial:    7.51 cm/s LV PW:         1.30 cm      LV E/e' medial:  14.3 LV IVS:        1.10 cm      LV e' lateral:   12.20 cm/s LVOT diam:     2.20 cm      LV E/e' lateral: 8.8 LV SV:         54 LV SV Index:   23           2D Longitudinal Strain LVOT Area:     3.80 cm     2D Strain GLS Avg:     -7.5 %  LV Volumes (MOD) LV vol d, MOD A2C: 156.0 ml LV vol d, MOD A4C: 131.0 ml LV vol s, MOD A2C: 105.0 ml LV vol s, MOD A4C: 64.8 ml LV SV MOD A2C:     51.0 ml LV SV MOD A4C:     131.0 ml LV SV MOD BP:      59.7 ml RIGHT VENTRICLE RV Basal diam:  4.00 cm RV S prime:     11.10 cm/s LEFT ATRIUM              Index       RIGHT ATRIUM           Index LA diam:        5.30 cm  2.28 cm/m  RA Area:     24.40 cm LA Vol (A2C):   95.1 ml  40.83 ml/m RA Volume:   79.20 ml  34.00 ml/m LA Vol (A4C):   111.0 ml 47.65 ml/m LA Biplane Vol: 104.0 ml 44.65 ml/m  AORTIC VALVE                   PULMONIC VALVE AV Area (Vmax):    2.89 cm    PV Vmax:       0.81 m/s AV Area (Vmean):   2.69 cm    PV Vmean:      57.500 cm/s AV Area (VTI):     2.88 cm    PV VTI:        0.141 m AV Vmax:           117.00 cm/s PV Peak grad:  2.6 mmHg AV Vmean:          85.500 cm/s PV Mean grad:  1.0 mmHg AV VTI:            0.186 m AV Peak Grad:      5.5 mmHg AV Mean Grad:      3.0 mmHg LVOT Vmax:         88.90 cm/s LVOT Vmean:        60.600 cm/s LVOT VTI:          0.141 m LVOT/AV VTI ratio: 0.76  AORTA Ao Root diam: 3.80 cm MITRAL VALVE                TRICUSPID VALVE MV Area (PHT): 6.74 cm     TR Peak grad:   22.7 mmHg MV Area VTI:   3.50 cm     TR Vmax:        238.00  cm/s MV Peak grad:  5.3 mmHg MV Mean grad:  2.0 mmHg     SHUNTS MV Vmax:       1.15 m/s     Systemic VTI:  0.14 m MV Vmean:      64.4 cm/s    Systemic Diam: 2.20 cm MV Decel Time: 113 msec MV E velocity: 107.50 cm/s [name] MD Electronically signed by Jerolyn Center  Arida MD Signature Date/Time: 12/24/2020/6:33:03 PM    Final      Echo LVEF 25 to 30%, echo performed while patient was in atrial fibrillation with rapid ventricular rate  TELEMETRY: Atrial fibrillation at 110 bpm:  ASSESSMENT AND PLAN:  Active Problems:   Atrial fibrillation with rapid ventricular response (HCC)   Acute diastolic CHF (congestive heart failure) (HCC)   Diabetes mellitus type 2, controlled, without complications (HCC)   Hypokalemia   CKD (chronic kidney disease), stage III (HCC)    1. New onset atrial fibrillation with rapid ventricular rate,with a chads vasc score of 3 (HTN, T2DM, CHF), with a history of alcohol use (drinks several beers 3x/week), and possible OSA. Patient remains in atrial fibrillation with improved rate, diltiazem drip transitioned to p.o. diltiazem, also on metoprolol tartrate 2. Acute systolic CHF, LVEF 25 to 30% by 2D echocardiogram which was performed while the patient was in atrial fibrillation with rapid ventricular rate. Patient reports a 2-week history of peripheral edema, exertional dyspnea, orthopnea, and constant chest heaviness with and without exertion. Chest xray negative for pulmonary edema. BNP mildly elevated to 271, high sensitivity troponin mildly elevated and down-trending (36 ->34).  Clinical improvement on IV furosemide. 3. Hypertension, blood pressure low normal on diltiazem, metoprolol and losartan 4. Type II diabetes 5. Hyperlipidemia, on Crestor  Recommendations  1.  Agree with overall current therapy 2.  Continue diuresis 3.  Carefully monitor renal status 4.  Transition diltiazem to Cardizem CD 5.  Uptitrate metoprolol tartrate to 50 mg twice daily 6.   Continue Eliquis for stroke prevention 7.  Limit alcohol consumption 8.  Consider discharge home later today  9.  Follow-up as outpatient to 2 weeks, will likely repeat 2D echocardiogram once patient back in sinus rhythm   Marcina Millard, MD, PhD, [facility] 12/26/2020 8:18 AM

## 2020-12-26 NOTE — Progress Notes (Signed)
Made dr. Frederick Peers aware last bp 99/74, heart rate 110's-130'. Per md hold scheduled losartan 50mg  po and give metoprolol 25 mg. Will continue to monitor

## 2020-12-27 LAB — BASIC METABOLIC PANEL
Anion gap: 8 (ref 5–15)
BUN: 25 mg/dL — ABNORMAL HIGH (ref 6–20)
CO2: 26 mmol/L (ref 22–32)
Calcium: 9.2 mg/dL (ref 8.9–10.3)
Chloride: 94 mmol/L — ABNORMAL LOW (ref 98–111)
Creatinine, Ser: 1.46 mg/dL — ABNORMAL HIGH (ref 0.61–1.24)
GFR, Estimated: 59 mL/min — ABNORMAL LOW (ref >60)
Glucose, Bld: 169 mg/dL — ABNORMAL HIGH (ref 70–99)
Potassium: 4.1 mmol/L (ref 3.5–5.1)
Sodium: 128 mmol/L — ABNORMAL LOW (ref 135–145)

## 2020-12-27 LAB — CBC WITH DIFFERENTIAL/PLATELET
Abs Immature Granulocytes: 0.03 10*3/uL (ref 0.00–0.07)
Basophils Absolute: 0.1 10*3/uL (ref 0.0–0.1)
Basophils Relative: 1 %
Eosinophils Absolute: 0.1 10*3/uL (ref 0.0–0.5)
Eosinophils Relative: 1 %
HCT: 45.9 % (ref 39.0–52.0)
Hemoglobin: 15.2 g/dL (ref 13.0–17.0)
Immature Granulocytes: 1 %
Lymphocytes Relative: 21 %
Lymphs Abs: 1.1 10*3/uL (ref 0.7–4.0)
MCH: 29.9 pg (ref 26.0–34.0)
MCHC: 33.1 g/dL (ref 30.0–36.0)
MCV: 90.4 fL (ref 80.0–100.0)
Monocytes Absolute: 0.7 10*3/uL (ref 0.1–1.0)
Monocytes Relative: 14 %
Neutro Abs: 3.2 10*3/uL (ref 1.7–7.7)
Neutrophils Relative %: 62 %
Platelets: 106 10*3/uL — ABNORMAL LOW (ref 150–400)
RBC: 5.08 MIL/uL (ref 4.22–5.81)
RDW: 13.2 % (ref 11.5–15.5)
WBC: 5.1 10*3/uL (ref 4.0–10.5)
nRBC: 0 % (ref 0.0–0.2)

## 2020-12-27 LAB — MAGNESIUM: Magnesium: 1.6 mg/dL — ABNORMAL LOW (ref 1.7–2.4)

## 2020-12-27 LAB — GLUCOSE, CAPILLARY
Glucose-Capillary: 179 mg/dL — ABNORMAL HIGH (ref 70–99)
Glucose-Capillary: 151 mg/dL — ABNORMAL HIGH (ref 70–99)

## 2020-12-27 MED ORDER — METOPROLOL TARTRATE 5 MG/5ML IV SOLN
5.0000 mg | Freq: Once | INTRAVENOUS | Status: AC
  Administered 2020-12-27: 5 mg via INTRAVENOUS
  Filled 2020-12-27: qty 5

## 2020-12-27 MED ORDER — METOPROLOL TARTRATE 50 MG PO TABS
100.0000 mg | ORAL_TABLET | Freq: Two times a day (BID) | ORAL | Status: DC
  Administered 2020-12-27: 100 mg via ORAL
  Filled 2020-12-27: qty 2

## 2020-12-27 MED ORDER — METOPROLOL TARTRATE 100 MG PO TABS
100.0000 mg | ORAL_TABLET | Freq: Two times a day (BID) | ORAL | 3 refills | Status: AC
Start: 2020-12-27 — End: ?

## 2020-12-27 MED ORDER — APIXABAN 5 MG PO TABS: 5.0000 mg | ORAL_TABLET | Freq: Two times a day (BID) | ORAL | 3 refills | Status: DC

## 2020-12-27 MED ORDER — DILTIAZEM HCL ER COATED BEADS 360 MG PO CP24: 360.0000 mg | ORAL_CAPSULE | Freq: Every day | ORAL | 3 refills | Status: DC

## 2020-12-27 MED ORDER — FUROSEMIDE 20 MG PO TABS: 20.0000 mg | ORAL_TABLET | Freq: Every day | ORAL | 3 refills | Status: DC

## 2020-12-27 NOTE — Progress Notes (Signed)
[facility] Cardiology  SUBJECTIVE: Patient sitting on side of the bed, eating breakfast, reports doing better, denies chest pain, shortness of breath, or palpitations   Vitals:   12/26/20 1618 12/26/20 2043 12/27/20 0506 12/27/20 0723  BP: (!) 86/73 (!) 144/73 (!) 116/91 121/87  Pulse: 73 (!) 106 95 (!) 43  Resp: (!) 21 20 (!) 22 18  Temp: 97.9 F (36.6 C) 98.1 F (36.7 C) 98.1 F (36.7 C) 97.7 F (36.5 C)  TempSrc: Oral Oral Oral Oral  SpO2: 100% 99% 99% 97%  Weight:   102.6 kg   Height:         Intake/Output Summary (Last 24 hours) at 12/27/2020 0813 Last data filed at 12/27/2020 1275 Gross per 24 hour  Intake 120 ml  Output 2125 ml  Net -2005 ml      PHYSICAL EXAM  General: Well developed, well nourished, in no acute distress HEENT:  Normocephalic and atramatic Neck:  No JVD.  Lungs: Clear bilaterally to auscultation and percussion. Heart: HRRR . Normal S1 and S2 without gallops or murmurs.  Abdomen: Bowel sounds are positive, abdomen soft and non-tender  Msk:  Back normal, normal gait. Normal strength and tone for age. Extremities: No clubbing, cyanosis or edema.   Neuro: Alert and oriented X 3. Psych:  Good affect, responds appropriately   LABS: Basic Metabolic Panel: Recent Labs    12/26/20 0638 12/27/20 0624  NA 133* 128*  K 3.9 4.1  CL 97* 94*  CO2 26 26  GLUCOSE 160* 169*  BUN 20 25*  CREATININE 1.47* 1.46*  CALCIUM 8.7* 9.2  MG  --  1.6*   Liver Function Tests: No results for input(s): AST, ALT, ALKPHOS, BILITOT, PROT, ALBUMIN in the last 72 hours. No results for input(s): LIPASE, AMYLASE in the last 72 hours. CBC: No results for input(s): WBC, NEUTROABS, HGB, HCT, MCV, PLT in the last 72 hours. Cardiac Enzymes: No results for input(s): CKTOTAL, CKMB, CKMBINDEX, TROPONINI in the last 72 hours. BNP: Invalid input(s): POCBNP D-Dimer: No results for input(s): DDIMER in the last 72 hours. Hemoglobin A1C: No results for input(s): HGBA1C in the last  72 hours. Fasting Lipid Panel: No results for input(s): CHOL, HDL, LDLCALC, TRIG, CHOLHDL, LDLDIRECT in the last 72 hours. Thyroid Function Tests: No results for input(s): TSH, T4TOTAL, T3FREE, THYROIDAB in the last 72 hours.  Invalid input(s): FREET3 Anemia Panel: No results for input(s): VITAMINB12, FOLATE, FERRITIN, TIBC, IRON, RETICCTPCT in the last 72 hours.  No results found.   Echo LVEF 25 to 30%, echocardiogram performed while patient was in atrial fibrillation with rapid ventricular rate  TELEMETRY: Atrial fibrillation at 110 bpm:  ASSESSMENT AND PLAN:  Principal Problem:   Atrial fibrillation with rapid ventricular response (HCC) Active Problems:   Acute systolic CHF (congestive heart failure) (HCC)   Diabetes mellitus type 2, controlled, without complications (HCC)   Hypokalemia   Chronic renal failure, stage 3a (HCC)    1. New onset atrial fibrillationwith rapid ventricular rate,with a chads vasc score of 3 (HTN, T2DM, CHF), with a history of alcohol use (drinks several beers 3x/week), and possible OSA.Patient remains in atrial fibrillation with improved rate, diltiazem drip transitioned to p.o. diltiazem, then Cardizem CD 360 mg daily, also on metoprolol tartrate, uptitrated to 100 mg twice daily 2. AcutesystolicCHF,LVEF 25 to 30% by 2D echocardiogram which was performed while the patient was in atrial fibrillation with rapid ventricular rate. Patient reports a 2-week history of peripheral edema, exertional dyspnea, orthopnea, and constant  chest heaviness with and without exertion. Chest xray negative for pulmonary edema. BNP mildly elevated to 271, high sensitivity troponin mildly elevated and down-trending (36 ->34).Clinical improvement on IV furosemide. 3. Hypertension, blood pressure low normal on Cardizem CD and metoprolol tartrate, losartan held 4. Type II diabetes 5. Hyperlipidemia, on Crestor  Recommendations  1.Agree with overall current  therapy 2.Continue diuresis 3.Carefully monitor renal status 4.Continue Cardizem CD 360 mg daily 5.Uptitrate metoprolol tartrate to 100 mg twice daily 6.Continue Eliquis for stroke prevention 7.Abstain from alcohol 8. Consider discharge home later today  9.Follow-up as outpatient to 1 week, will likely repeat 2D echocardiogram once patient back in sinus rhythm  Marcina Millard, MD, PhD, [facility] 12/27/2020 8:13 AM

## 2020-12-27 NOTE — Progress Notes (Signed)
Mobility Specialist - Progress Note    12/27/20 1400  Mobility  Activity Ambulated in room  Level of Assistance Independent  Assistive Device None  Mobility Response Tolerated well  Mobility performed by Mobility specialist  $Mobility charge 1 Mobility     Pt seen ambulating in room. Mobility monitored telemetry. HR ranging between 110-130 bpm.    Filiberto Pinks Mobility Specialist 12/27/20, 2:06 PM

## 2020-12-27 NOTE — Discharge Summary (Signed)
Physician Discharge Summary   George Shaw YYT:[id number] DOB: 11/17/1970 DOA: 12/23/2020  PCP: Toni Arthurs, NP  Admit date: 12/23/2020 Discharge date: 12/27/2020   Admitted From: home Disposition:  home Discharging physician: Dwyane Dee, MD  Recommendations for Outpatient Follow-up:  1. Follow up rate control 2. Repeat echo per cardiology timing 3. Follow up compliance with Eliquis in case of need for DCCV   Patient discharged to home in Discharge Condition: stable Risk of unplanned readmission score: Unplanned Admission- Pilot do not use: 13.39  CODE STATUS: Full Diet recommendation:  Diet Orders (From admission, onward)    Start     Ordered   12/27/20 0000  Diet - low sodium heart healthy        12/27/20 1502   12/23/20 2021  Diet heart healthy/carb modified Room service appropriate? Yes; Fluid consistency: Thin  Diet effective now       Question Answer Comment  Diet-HS Snack? Nothing   Room service appropriate? Yes   Fluid consistency: Thin      12/23/20 2023          Hospital Course: [name] is a 50 yo male with PMH alcohol use, DMII, HTN who presented to the ER with lower extremity edema, and worsening shortness of breath at home.  On work-up he was found to be in A. fib with RVR. He was also evaluated by cardiology during hospitalization.  He was started on anticoagulation and rate control with short acting diltiazem initially.  This was transitioned to extended release and up titration of metoprolol was also continued during hospitalization. He underwent echo which showed EF 25 to 30% with global hypokinesis.  Cardiology recommended to continue with current regimen in place.  He was also on losartan during hospitalization however blood pressure could not tolerate and this was discontinued.  If blood pressure amenable at hospital follow-up, this could be resumed if able.   * Atrial fibrillation with rapid ventricular response (HCC) -New  diagnosis -Followed by cardiology during hospitalization - started on Eliquis for anticoagulation  -Cardizem has been transitioned to CD formulation.  Monitor vitals response -Losartan discontinued due to hypotension -Continue Lopressor and adjust as needed.  Was increased up to 100 mg twice daily prior to discharge -Follow-up rate control at outpatient visit: Meds at discharge: Cardizem CD 360 mg daily, Lopressor 100 mg twice daily  Acute systolic CHF (congestive heart failure) (HCC) - BP cannot tolerate losartan at this time, has been d/c for now -EF 25 to 30% with global hypokinesis - continue Eliquis, lopressor, cardizem per cardiology - outpatient follow up for repeat echo and possible cardioversion if/when indicated  Chronic renal failure, stage 3a ([place]) - patient has history of CKD3a. Baseline creat ~ 1.6, eGFR 50-53  Hypokalemia -Replete and recheck as needed  Diabetes mellitus type 2, controlled, without complications (HCC) -E7N 7.3% -Continue SSI and CBG monitoring    The patient's chronic medical conditions were treated accordingly per the patient's home medication regimen except as noted.  On day of discharge, patient was felt deemed stable for discharge. Patient/family member advised to call PCP or come back to ER if needed.   Principal Diagnosis: Atrial fibrillation with rapid ventricular response [facility])  Discharge Diagnoses: Active Hospital Problems   Diagnosis Date Noted  . Atrial fibrillation with rapid ventricular response ([place]) 12/23/2020    Priority: High  . Acute systolic CHF (congestive heart failure) ([place]) 12/24/2020    Priority: High  . Diabetes mellitus type 2, controlled, without complications ([place])  12/24/2020  . Hypokalemia 12/24/2020  . Chronic renal failure, stage 3a ([place]) 12/24/2020    Resolved Hospital Problems  No resolved problems to display.    Discharge Instructions    Diet - low sodium heart healthy   Complete by: As directed     Increase activity slowly   Complete by: As directed      Allergies as of 12/27/2020   No Known Allergies     Medication List    STOP taking these medications   hydrochlorothiazide 12.5 MG capsule Commonly known as: MICROZIDE   losartan 50 MG tablet Commonly known as: COZAAR     TAKE these medications   apixaban 5 MG Tabs tablet Commonly known as: ELIQUIS Take 1 tablet (5 mg total) by mouth 2 (two) times daily.   diltiazem 360 MG 24 hr capsule Commonly known as: CARDIZEM CD Take 1 capsule (360 mg total) by mouth daily. Start taking on: December 28, 2020   furosemide 20 MG tablet Commonly known as: Lasix Take 1 tablet (20 mg total) by mouth daily.   Januvia 100 MG tablet Generic drug: sitaGLIPtin Take 100 mg by mouth daily.   metFORMIN 500 MG 24 hr tablet Commonly known as: GLUCOPHAGE-XR SMARTSIG:2 Tablet(s) By Mouth Every Evening   metoprolol tartrate 100 MG tablet Commonly known as: LOPRESSOR Take 1 tablet (100 mg total) by mouth 2 (two) times daily.   omeprazole 20 MG tablet Commonly known as: PRILOSEC OTC Take 20 mg by mouth daily.   rosuvastatin 10 MG tablet Commonly known as: CRESTOR Take 10 mg by mouth daily.       Follow-up Information    Toni Arthurs, NP. Go on 12/31/2020.   Specialty: Family Medicine Why: $RemoveBefor'@3'toSmojPHbDqP$ :30pm Have labwork checked to monitor kidney function on new meds Contact information: [place] [address] [phone]              No Known Allergies  Consultations: Cardiology  Discharge Exam: BP 109/83 (BP Location: Left Arm)   Pulse (!) 118   Temp 98.1 F (36.7 C) (Oral)   Resp 18   Ht $R'6\' 2"'zM$  (1.88 m)   Wt 102.6 kg   SpO2 99%   BMI 29.03 kg/m  General appearance: alert, cooperative and no distress Head: Normocephalic, without obvious abnormality, atraumatic Eyes: EOMI Lungs: clear to auscultation bilaterally Heart: irregularly irregular rhythm and S1, S2 normal Abdomen: normal findings: bowel  sounds normal and soft, non-tender Extremities: no edema Skin: mobility and turgor normal Neurologic: Grossly normal  The results of significant diagnostics from this hospitalization (including imaging, microbiology, ancillary and laboratory) are listed below for reference.   Microbiology: Recent Results (from the past 240 hour(s))  SARS CORONAVIRUS 2 (TAT 6-24 HRS) Nasopharyngeal Nasopharyngeal Swab     Status: None   Collection Time: 12/23/20  7:24 PM   Specimen: Nasopharyngeal Swab  Result Value Ref Range Status   SARS Coronavirus 2 NEGATIVE NEGATIVE Final    Comment: (NOTE) SARS-CoV-2 target nucleic acids are NOT DETECTED.  The SARS-CoV-2 RNA is generally detectable in upper and lower respiratory specimens during the acute phase of infection. Negative results do not preclude SARS-CoV-2 infection, do not rule out co-infections with other pathogens, and should not be used as the sole basis for treatment or other patient management decisions. Negative results must be combined with clinical observations, patient history, and epidemiological information. The expected result is Negative.  Fact Sheet for Patients: SugarRoll.be  Fact Sheet for Healthcare Providers: https://www.woods-mathews.com/  This  test is not yet approved or cleared by the [place] and  has been authorized for detection and/or diagnosis of SARS-CoV-2 by FDA under an Emergency Use Authorization (EUA). This EUA will remain  in effect (meaning this test can be used) for the duration of the COVID-19 declaration under Se ction 564(b)(1) of the Act, 21 U.S.C. section 360bbb-3(b)(1), unless the authorization is terminated or revoked sooner.  Performed at [facility], [place] [address], [place], [place] [address]      Labs: BNP (last 3 results) Recent Labs    12/23/20 1720  BNP 751.7*   Basic Metabolic Panel: Recent Labs  Lab 12/23/20 1720  12/23/20 1928 12/24/20 0420 12/25/20 0904 12/26/20 0638 12/27/20 0624  NA 132*  --  134* 130* 133* 128*  K 3.2*  --  3.2* 3.9 3.9 4.1  CL 92*  --  95* 94* 97* 94*  CO2 25  --  $R'25 24 26 26  'bc$ GLUCOSE 149*  --  148* 206* 160* 169*  BUN 14  --  $R'13 18 20 'gO$ 25*  CREATININE 1.65*  --  1.61* 1.64* 1.47* 1.46*  CALCIUM 9.3  --  9.1 8.9 8.7* 9.2  MG  --  1.3* 1.9  --   --  1.6*   Liver Function Tests: No results for input(s): AST, ALT, ALKPHOS, BILITOT, PROT, ALBUMIN in the last 168 hours. No results for input(s): LIPASE, AMYLASE in the last 168 hours. No results for input(s): AMMONIA in the last 168 hours. CBC: Recent Labs  Lab 12/23/20 1720 12/24/20 0420 12/27/20 0624  WBC 6.8 5.4 5.1  NEUTROABS  --   --  3.2  HGB 15.9 15.4 15.2  HCT 46.2 44.0 45.9  MCV 87.2 88.2 90.4  PLT 172 133* 106*   Cardiac Enzymes: No results for input(s): CKTOTAL, CKMB, CKMBINDEX, TROPONINI in the last 168 hours. BNP: Invalid input(s): POCBNP CBG: Recent Labs  Lab 12/26/20 1103 12/26/20 1548 12/26/20 2042 12/27/20 0722 12/27/20 1207  GLUCAP 152* 194* 191* 179* 151*   D-Dimer No results for input(s): DDIMER in the last 72 hours. Hgb A1c No results for input(s): HGBA1C in the last 72 hours. Lipid Profile No results for input(s): CHOL, HDL, LDLCALC, TRIG, CHOLHDL, LDLDIRECT in the last 72 hours. Thyroid function studies No results for input(s): TSH, T4TOTAL, T3FREE, THYROIDAB in the last 72 hours.  Invalid input(s): FREET3 Anemia work up No results for input(s): VITAMINB12, FOLATE, FERRITIN, TIBC, IRON, RETICCTPCT in the last 72 hours. Urinalysis    Component Value Date/Time   COLORURINE YELLOW 12/27/2013 1842   APPEARANCEUR HAZY 12/27/2013 1842   LABSPEC >=1.030 12/27/2013 1842   PHURINE 6.0 12/27/2013 1842   GLUCOSEU NEGATIVE 12/27/2013 1842   HGBUR TRACE 12/27/2013 1842   BILIRUBINUR NEGATIVE 12/27/2013 1842   KETONESUR NEGATIVE 12/27/2013 1842   PROTEINUR TRACE 12/27/2013 1842    NITRITE NEGATIVE 12/27/2013 1842   LEUKOCYTESUR TRACE 12/27/2013 1842   Sepsis Labs Invalid input(s): PROCALCITONIN,  WBC,  LACTICIDVEN Microbiology Recent Results (from the past 240 hour(s))  SARS CORONAVIRUS 2 (TAT 6-24 HRS) Nasopharyngeal Nasopharyngeal Swab     Status: None   Collection Time: 12/23/20  7:24 PM   Specimen: Nasopharyngeal Swab  Result Value Ref Range Status   SARS Coronavirus 2 NEGATIVE NEGATIVE Final    Comment: (NOTE) SARS-CoV-2 target nucleic acids are NOT DETECTED.  The SARS-CoV-2 RNA is generally detectable in upper and lower respiratory specimens during the acute phase of infection. Negative results do not  preclude SARS-CoV-2 infection, do not rule out co-infections with other pathogens, and should not be used as the sole basis for treatment or other patient management decisions. Negative results must be combined with clinical observations, patient history, and epidemiological information. The expected result is Negative.  Fact Sheet for Patients: SugarRoll.be  Fact Sheet for Healthcare Providers: https://www.woods-mathews.com/  This test is not yet approved or cleared by the [place] FDA and  has been authorized for detection and/or diagnosis of SARS-CoV-2 by FDA under an Emergency Use Authorization (EUA). This EUA will remain  in effect (meaning this test can be used) for the duration of the COVID-19 declaration under Se ction 564(b)(1) of the Act, 21 U.S.C. section 360bbb-3(b)(1), unless the authorization is terminated or revoked sooner.  Performed at [facility], [place] [address], [place], [place] [address]     Procedures/Studies: DG Chest Portable 1 View  Result Date: 12/23/2020 CLINICAL DATA:  Chest pain and dyspnea EXAM: PORTABLE CHEST 1 VIEW COMPARISON:  None. FINDINGS: Pacer pad overlies the upper left chest. Top-normal heart size. Normal mediastinal contour. No pneumothorax. No pleural  effusion. Lungs appear clear, with no acute consolidative airspace disease and no pulmonary edema. IMPRESSION: No active disease. Electronically Signed   By: Ilona Sorrel M.D.   On: 12/23/2020 18:52   ECHOCARDIOGRAM COMPLETE  Result Date: 12/24/2020    ECHOCARDIOGRAM REPORT   Patient Name:   George Shaw Date of Exam: 12/24/2020 Medical Rec #:  [id number]        Height:       74.0 in Accession #:    [id number]       Weight:       235.4 lb Date of Birth:  1971-07-11        BSA:          2.329 m Patient Age:    89 years         BP:           102/76 mmHg Patient Gender: M                HR:           104 bpm. Exam Location:  [facility] Procedure: 2D Echo, Color Doppler, Cardiac Doppler and Strain Analysis Indications:     I48.91 Atrial fibrillation; I50.31 CHF-Acute Diastolic  History:         Patient has no prior history of Echocardiogram examinations.                  Risk Factors:Hypertension, Diabetes and HCL.  Sonographer:     Charmayne Sheer RDCS (AE) Referring Phys:  [id number] [place] Diagnosing Phys: Kathlyn Sacramento MD  Sonographer Comments: Global longitudinal strain was attempted. IMPRESSIONS  1. Left ventricular ejection fraction, by estimation, is 25 to 30%. The left ventricle has severely decreased function. The left ventricle demonstrates global hypokinesis. The left ventricular internal cavity size was mildly dilated. There is mild left ventricular hypertrophy. Left ventricular diastolic parameters are indeterminate. The average left ventricular global longitudinal strain is -7.5 %. The global longitudinal strain is abnormal.  2. Right ventricular systolic function is normal. The right ventricular size is normal. There is normal pulmonary artery systolic pressure.  3. Left atrial size was moderately dilated.  4. Right atrial size was moderately dilated.  5. The mitral valve is normal in structure. Mild mitral valve regurgitation. No evidence of mitral stenosis.  6. The aortic valve is normal in structure.  Aortic valve regurgitation  is not visualized. No aortic stenosis is present. FINDINGS  Left Ventricle: Left ventricular ejection fraction, by estimation, is 25 to 30%. The left ventricle has severely decreased function. The left ventricle demonstrates global hypokinesis. The average left ventricular global longitudinal strain is -7.5 %. The global longitudinal strain is abnormal. The left ventricular internal cavity size was mildly dilated. There is mild left ventricular hypertrophy. Left ventricular diastolic parameters are indeterminate. Right Ventricle: The right ventricular size is normal. No increase in right ventricular wall thickness. Right ventricular systolic function is normal. There is normal pulmonary artery systolic pressure. The tricuspid regurgitant velocity is 2.38 m/s, and  with an assumed right atrial pressure of 5 mmHg, the estimated right ventricular systolic pressure is 15.1 mmHg. Left Atrium: Left atrial size was moderately dilated. Right Atrium: Right atrial size was moderately dilated. Pericardium: There is no evidence of pericardial effusion. Mitral Valve: The mitral valve is normal in structure. Mild mitral valve regurgitation. No evidence of mitral valve stenosis. MV peak gradient, 5.3 mmHg. The mean mitral valve gradient is 2.0 mmHg. Tricuspid Valve: The tricuspid valve is normal in structure. Tricuspid valve regurgitation is mild . No evidence of tricuspid stenosis. Aortic Valve: The aortic valve is normal in structure. Aortic valve regurgitation is not visualized. No aortic stenosis is present. Aortic valve mean gradient measures 3.0 mmHg. Aortic valve peak gradient measures 5.5 mmHg. Aortic valve area, by VTI measures 2.88 cm. Pulmonic Valve: The pulmonic valve was normal in structure. Pulmonic valve regurgitation is not visualized. No evidence of pulmonic stenosis. Aorta: The aortic root is normal in size and structure. Venous: The inferior vena cava was not well visualized.  IAS/Shunts: No atrial level shunt detected by color flow Doppler.  LEFT VENTRICLE PLAX 2D LVIDd:         5.00 cm      Diastology LVIDs:         4.60 cm      LV e' medial:    7.51 cm/s LV PW:         1.30 cm      LV E/e' medial:  14.3 LV IVS:        1.10 cm      LV e' lateral:   12.20 cm/s LVOT diam:     2.20 cm      LV E/e' lateral: 8.8 LV SV:         54 LV SV Index:   23           2D Longitudinal Strain LVOT Area:     3.80 cm     2D Strain GLS Avg:     -7.5 %  LV Volumes (MOD) LV vol d, MOD A2C: 156.0 ml LV vol d, MOD A4C: 131.0 ml LV vol s, MOD A2C: 105.0 ml LV vol s, MOD A4C: 64.8 ml LV SV MOD A2C:     51.0 ml LV SV MOD A4C:     131.0 ml LV SV MOD BP:      59.7 ml RIGHT VENTRICLE RV Basal diam:  4.00 cm RV S prime:     11.10 cm/s LEFT ATRIUM              Index       RIGHT ATRIUM           Index LA diam:        5.30 cm  2.28 cm/m  RA Area:     24.40 cm LA Vol (A2C):   95.1 ml  40.83 ml/m RA Volume:   79.20 ml  34.00 ml/m LA Vol (A4C):   111.0 ml 47.65 ml/m LA Biplane Vol: 104.0 ml 44.65 ml/m  AORTIC VALVE                   PULMONIC VALVE AV Area (Vmax):    2.89 cm    PV Vmax:       0.81 m/s AV Area (Vmean):   2.69 cm    PV Vmean:      57.500 cm/s AV Area (VTI):     2.88 cm    PV VTI:        0.141 m AV Vmax:           117.00 cm/s PV Peak grad:  2.6 mmHg AV Vmean:          85.500 cm/s PV Mean grad:  1.0 mmHg AV VTI:            0.186 m AV Peak Grad:      5.5 mmHg AV Mean Grad:      3.0 mmHg LVOT Vmax:         88.90 cm/s LVOT Vmean:        60.600 cm/s LVOT VTI:          0.141 m LVOT/AV VTI ratio: 0.76  AORTA Ao Root diam: 3.80 cm MITRAL VALVE                TRICUSPID VALVE MV Area (PHT): 6.74 cm     TR Peak grad:   22.7 mmHg MV Area VTI:   3.50 cm     TR Vmax:        238.00 cm/s MV Peak grad:  5.3 mmHg MV Mean grad:  2.0 mmHg     SHUNTS MV Vmax:       1.15 m/s     Systemic VTI:  0.14 m MV Vmean:      64.4 cm/s    Systemic Diam: 2.20 cm MV Decel Time: 113 msec MV E velocity: 107.50 cm/s Kathlyn Sacramento MD  Electronically signed by Kathlyn Sacramento MD Signature Date/Time: 12/24/2020/6:33:03 PM    Final      Time coordinating discharge: Over 30 minutes    Dwyane Dee, MD  [facility] 12/27/2020, 5:21 PM

## 2021-01-01 NOTE — Progress Notes (Signed)
Patient ID: George Shaw, male    DOB: Dec 02, 1970, 50 y.o.   MRN: [id number]  HPI  George Shaw is a 50 y/o male with a history of atrial fibrillation, DM, HTN, current tobacco use and chronic heart failure.   Echo report from 12/24/20 reviewed and showed an EF of 25-30% along with mild LVH along with normal PA pressure and mild George.   Admitted 12/23/20 due to shortness of breath and worsening pedal edema. Found to be in AF with RVR. Cardiology consult obtained. Started on anticoagulant and cardizem. Losartan stopped due to hypotension. Discharged after 4 days.   He presents today for his initial visit with a chief complaint of moderate fatigue with little exertion. He describes this as having been present for several weeks where he has to take naps during the day. He has associated chest tightness, cough, shortness of breath, palpitations and difficulty sleeping (with orthopnea) along with this. He denies any abdominal distention, pedal edema, chest pain, dizziness or weight gain.   He checks his BP and HR daily at home and says that sometimes his BP "is low" so he won't take his diltiazem nor metoprolol. Says that his HR ranges from 40's- low 120's.   Saw his PCP 2 days ago and says that she did a CXR and he received a message that she was calling in a zpack "due to fluid in his lungs".   He and his fiance' say that they both thought he was seeing cardiologist today about his afib and would be having echo repeated.   Past Medical History:  Diagnosis Date  . Arrhythmia    atrial fibrillation  . CHF (congestive heart failure) (HCC)   . Diabetes mellitus without complication (HCC)   . Hypertension    History reviewed. No pertinent surgical history. History reviewed. No pertinent family history. Social History   Tobacco Use  . Smoking status: Light Tobacco Smoker    Types: Cigars  . Smokeless tobacco: Never Used  Substance Use Topics  . Alcohol use: Not Currently   No Known  Allergies   Prior to Admission medications   Medication Sig Start Date End Date Taking? Authorizing Provider  apixaban (ELIQUIS) 5 MG TABS tablet Take 1 tablet (5 mg total) by mouth 2 (two) times daily. 12/27/20  Yes Lewie Chamber, MD  diltiazem (CARDIZEM CD) 360 MG 24 hr capsule Take 1 capsule (360 mg total) by mouth daily. 12/28/20  Yes Lewie Chamber, MD  furosemide (LASIX) 20 MG tablet Take 1 tablet (20 mg total) by mouth daily. 12/27/20 12/27/21 Yes Lewie Chamber, MD  JANUVIA 100 MG tablet Take 100 mg by mouth daily. 12/10/20  Yes [provider]  metFORMIN (GLUCOPHAGE-XR) 500 MG 24 hr tablet SMARTSIG:2 Tablet(s) By Mouth Every Evening 12/11/20  Yes [provider]  metoprolol tartrate (LOPRESSOR) 100 MG tablet Take 1 tablet (100 mg total) by mouth 2 (two) times daily.  12/27/20  Yes Lewie Chamber, MD  omeprazole (PRILOSEC OTC) 20 MG tablet Take 20 mg by mouth daily.   Yes [provider]  rosuvastatin (CRESTOR) 10 MG tablet Take 10 mg by mouth daily. 12/11/20  Yes [provider]    Review of Systems  Constitutional: Positive for fatigue (easily). Negative for appetite change.  HENT: Negative for congestion, rhinorrhea and sore throat.   Eyes: Negative.   Respiratory: Positive for cough (dry), chest tightness (when laying down at night) and shortness of breath.   Cardiovascular: Positive for palpitations. Negative for chest  pain and leg swelling.  Gastrointestinal: Negative for abdominal distention and abdominal pain.  Endocrine: Negative.   Musculoskeletal: Negative for back pain and neck pain.  Allergic/Immunologic: Negative.   Neurological: Negative for dizziness and light-headedness.  Hematological: Negative for adenopathy. Does not bruise/bleed easily.  Psychiatric/Behavioral: Positive for sleep disturbance (sleeping on 1-3 pillows). Negative for dysphoric mood. The patient is not nervous/anxious.    Vitals:   01/02/21 1422  BP: (!) 137/98   Pulse: (!) 49  Resp: 20  SpO2: 100%  Weight: 228 lb (103.4 kg)  Height: 6\' 4"  (1.93 m)   Wt Readings from Last 3 Encounters:  01/02/21 228 lb (103.4 kg)  12/27/20 226 lb 1.6 oz (102.6 kg)   Lab Results  Component Value Date   CREATININE 1.46 (H) 12/27/2020   CREATININE 1.47 (H) 12/26/2020   CREATININE 1.64 (H) 12/25/2020    Physical Exam Vitals and nursing note reviewed. Exam conducted with a chaperone present (fiance').  Constitutional:      Appearance: Normal appearance.  HENT:     Head: Normocephalic and atraumatic.  Cardiovascular:     Rate and Rhythm: Normal rate. Rhythm irregular.  Pulmonary:     Effort: Pulmonary effort is normal. No respiratory distress.     Breath sounds: No wheezing or rales.  Abdominal:     General: There is no distension.     Palpations: Abdomen is soft.     Tenderness: There is no abdominal tenderness.  Musculoskeletal:        General: No tenderness.     Cervical back: Normal range of motion and neck supple.     Right lower leg: No edema.     Left lower leg: No edema.  Skin:    General: Skin is warm and dry.  Neurological:     General: No focal deficit present.     Mental Status: He is alert and oriented to person, place, and time.  Psychiatric:        Mood and Affect: Mood normal.        Behavior: Behavior normal.        Thought Content: Thought content normal.   Assessment & Plan:  1: Chronic heart failure with reduced ejection fraction- - NYHA class III - euvolemic today - weighing daily; reminded to call for an overnight weight gain of > 2 pounds or a weekly weight gain of > 5 pounds - not adding salt to his food and has been reading food labels for sodium content - says that he's going to be starting a zpack today but he concerned because when he lies down in the bed, he feels short of breath - advised him that should his shortness of breath continue after a couple of days on the antibiotic, he can double his furosemide  -  reviewed the use of metoprolol with his HF and AF and he's agreeable to resuming but will resume at a lower dose of 50mg  BID (1/2 tablet BID); consider changing to succinate version once he uses up what he just bought  2: HTN- - BP mildly elevated today; resuming metoprolol per above - he hasn't been taking his metoprolol nor diltiazem daily consistently because of his blood pressure being low at home sometimes - saw PCP 12/27/2020) 12/31/20 - BMP 12/27/20 reviewed and showed sodium 128, potassium 4.1, creatinine 1.46 and GFR 59  3: DM- - A1c 12/24/20 was 7.3% - glucose at home today was 172  4: Atrial fibrillation-  - saw cardiology 12/29/20)  during recent admission but does not have f/u appointment scheduled - called Dr Darrold Junker office and scheduled appointment on 01/09/21 - HR today irregular and ranges from 49-100; resuming low dose metoprolol per above   Patient did not bring her medications nor a list. Each medication was verbally reviewed with the patient and she was encouraged to bring the bottles to every visit to confirm accuracy of list.  Due to his appointments and his desire to return to work, he prefers to not make a follow-up appointment at this time. Advised patient that he could call us back at anytime for questions or to make another appointment and patient was comfortable with this plan.

## 2021-01-02 ENCOUNTER — Other Ambulatory Visit: Payer: Self-pay

## 2021-01-02 ENCOUNTER — Encounter: Payer: Self-pay | Admitting: Family

## 2021-01-02 ENCOUNTER — Ambulatory Visit: Payer: BC Managed Care – PPO | Attending: Family | Admitting: Family

## 2021-01-02 VITALS — BP 137/98 | HR 49 | Resp 20 | Ht 76.0 in | Wt 228.0 lb

## 2021-01-02 DIAGNOSIS — E119 Type 2 diabetes mellitus without complications: Secondary | ICD-10-CM | POA: Insufficient documentation

## 2021-01-02 DIAGNOSIS — F17290 Nicotine dependence, other tobacco product, uncomplicated: Secondary | ICD-10-CM | POA: Insufficient documentation

## 2021-01-02 DIAGNOSIS — R0789 Other chest pain: Secondary | ICD-10-CM | POA: Diagnosis not present

## 2021-01-02 DIAGNOSIS — I4891 Unspecified atrial fibrillation: Secondary | ICD-10-CM | POA: Insufficient documentation

## 2021-01-02 DIAGNOSIS — I10 Essential (primary) hypertension: Secondary | ICD-10-CM

## 2021-01-02 DIAGNOSIS — I5022 Chronic systolic (congestive) heart failure: Secondary | ICD-10-CM | POA: Diagnosis not present

## 2021-01-02 DIAGNOSIS — Z79899 Other long term (current) drug therapy: Secondary | ICD-10-CM | POA: Diagnosis not present

## 2021-01-02 DIAGNOSIS — Z7901 Long term (current) use of anticoagulants: Secondary | ICD-10-CM | POA: Insufficient documentation

## 2021-01-02 DIAGNOSIS — I110 Hypertensive heart disease with heart failure: Secondary | ICD-10-CM | POA: Diagnosis not present

## 2021-01-02 DIAGNOSIS — Z7984 Long term (current) use of oral hypoglycemic drugs: Secondary | ICD-10-CM | POA: Diagnosis not present

## 2021-01-02 NOTE — Patient Instructions (Addendum)
Continue weighing daily and call for an overnight weight gain of > 2 pounds or a weekly weight gain of >5 pounds.   Decrease metoprolol to 1/2 tablet (50mg ) twice a day  Can double up on your fluid pill if the antibiotic doesn't help   Dr. [facility] [address] [place], [place], [place] Wed 3/30 at 1030am    Call 4/30 in the future if you need [place] for anything

## 2021-01-06 ENCOUNTER — Other Ambulatory Visit: Payer: Self-pay

## 2021-01-06 ENCOUNTER — Inpatient Hospital Stay
Admission: EM | Admit: 2021-01-06 | Discharge: 2021-01-10 | DRG: 286 | Disposition: A | Discharge status: Home / Self Care | Payer: BC Managed Care – PPO | Attending: Internal Medicine | Admitting: Internal Medicine

## 2021-01-06 ENCOUNTER — Inpatient Hospital Stay: Payer: BC Managed Care – PPO

## 2021-01-06 DIAGNOSIS — E1122 Type 2 diabetes mellitus with diabetic chronic kidney disease: Secondary | ICD-10-CM | POA: Diagnosis present

## 2021-01-06 DIAGNOSIS — F17290 Nicotine dependence, other tobacco product, uncomplicated: Secondary | ICD-10-CM | POA: Diagnosis present

## 2021-01-06 DIAGNOSIS — I5023 Acute on chronic systolic (congestive) heart failure: Secondary | ICD-10-CM

## 2021-01-06 DIAGNOSIS — K219 Gastro-esophageal reflux disease without esophagitis: Secondary | ICD-10-CM | POA: Diagnosis present

## 2021-01-06 DIAGNOSIS — E871 Hypo-osmolality and hyponatremia: Secondary | ICD-10-CM | POA: Diagnosis present

## 2021-01-06 DIAGNOSIS — R079 Chest pain, unspecified: Secondary | ICD-10-CM | POA: Diagnosis present

## 2021-01-06 DIAGNOSIS — Z833 Family history of diabetes mellitus: Secondary | ICD-10-CM

## 2021-01-06 DIAGNOSIS — Z6825 Body mass index (BMI) 25.0-25.9, adult: Secondary | ICD-10-CM

## 2021-01-06 DIAGNOSIS — E1129 Type 2 diabetes mellitus with other diabetic kidney complication: Secondary | ICD-10-CM | POA: Diagnosis present

## 2021-01-06 DIAGNOSIS — N1831 Chronic kidney disease, stage 3a: Secondary | ICD-10-CM | POA: Diagnosis not present

## 2021-01-06 DIAGNOSIS — I471 Supraventricular tachycardia: Secondary | ICD-10-CM | POA: Diagnosis present

## 2021-01-06 DIAGNOSIS — I4819 Other persistent atrial fibrillation: Principal | ICD-10-CM | POA: Diagnosis present

## 2021-01-06 DIAGNOSIS — Z720 Tobacco use: Secondary | ICD-10-CM | POA: Diagnosis present

## 2021-01-06 DIAGNOSIS — E785 Hyperlipidemia, unspecified: Secondary | ICD-10-CM | POA: Diagnosis present

## 2021-01-06 DIAGNOSIS — Z79899 Other long term (current) drug therapy: Secondary | ICD-10-CM

## 2021-01-06 DIAGNOSIS — Z20822 Contact with and (suspected) exposure to covid-19: Secondary | ICD-10-CM | POA: Diagnosis present

## 2021-01-06 DIAGNOSIS — I5022 Chronic systolic (congestive) heart failure: Secondary | ICD-10-CM

## 2021-01-06 DIAGNOSIS — I4892 Unspecified atrial flutter: Secondary | ICD-10-CM | POA: Diagnosis present

## 2021-01-06 DIAGNOSIS — I340 Nonrheumatic mitral (valve) insufficiency: Secondary | ICD-10-CM | POA: Diagnosis not present

## 2021-01-06 DIAGNOSIS — F1010 Alcohol abuse, uncomplicated: Secondary | ICD-10-CM | POA: Diagnosis present

## 2021-01-06 DIAGNOSIS — Z7984 Long term (current) use of oral hypoglycemic drugs: Secondary | ICD-10-CM

## 2021-01-06 DIAGNOSIS — I959 Hypotension, unspecified: Secondary | ICD-10-CM | POA: Diagnosis present

## 2021-01-06 DIAGNOSIS — R778 Other specified abnormalities of plasma proteins: Secondary | ICD-10-CM | POA: Diagnosis present

## 2021-01-06 DIAGNOSIS — E663 Overweight: Secondary | ICD-10-CM | POA: Diagnosis present

## 2021-01-06 DIAGNOSIS — I130 Hypertensive heart and chronic kidney disease with heart failure and stage 1 through stage 4 chronic kidney disease, or unspecified chronic kidney disease: Secondary | ICD-10-CM | POA: Diagnosis present

## 2021-01-06 DIAGNOSIS — I428 Other cardiomyopathies: Secondary | ICD-10-CM | POA: Diagnosis present

## 2021-01-06 DIAGNOSIS — Z635 Disruption of family by separation and divorce: Secondary | ICD-10-CM | POA: Diagnosis not present

## 2021-01-06 DIAGNOSIS — Z7901 Long term (current) use of anticoagulants: Secondary | ICD-10-CM | POA: Diagnosis not present

## 2021-01-06 DIAGNOSIS — I248 Other forms of acute ischemic heart disease: Secondary | ICD-10-CM | POA: Diagnosis present

## 2021-01-06 DIAGNOSIS — I10 Essential (primary) hypertension: Secondary | ICD-10-CM | POA: Diagnosis not present

## 2021-01-06 DIAGNOSIS — I4891 Unspecified atrial fibrillation: Secondary | ICD-10-CM | POA: Diagnosis present

## 2021-01-06 DIAGNOSIS — E1165 Type 2 diabetes mellitus with hyperglycemia: Secondary | ICD-10-CM | POA: Diagnosis present

## 2021-01-06 DIAGNOSIS — I361 Nonrheumatic tricuspid (valve) insufficiency: Secondary | ICD-10-CM | POA: Diagnosis not present

## 2021-01-06 DIAGNOSIS — R7989 Other specified abnormal findings of blood chemistry: Secondary | ICD-10-CM | POA: Diagnosis present

## 2021-01-06 DIAGNOSIS — R002 Palpitations: Secondary | ICD-10-CM | POA: Diagnosis present

## 2021-01-06 DIAGNOSIS — E860 Dehydration: Secondary | ICD-10-CM | POA: Diagnosis present

## 2021-01-06 DIAGNOSIS — R0602 Shortness of breath: Secondary | ICD-10-CM

## 2021-01-06 DIAGNOSIS — J189 Pneumonia, unspecified organism: Secondary | ICD-10-CM | POA: Diagnosis present

## 2021-01-06 LAB — URINE DRUG SCREEN, QUALITATIVE (ARMC ONLY)
Amphetamines, Ur Screen: NOT DETECTED
Barbiturates, Ur Screen: NOT DETECTED
Benzodiazepine, Ur Scrn: POSITIVE — AB
Cannabinoid 50 Ng, Ur ~~LOC~~: NOT DETECTED
Cocaine Metabolite,Ur ~~LOC~~: NOT DETECTED
MDMA (Ecstasy)Ur Screen: NOT DETECTED
Methadone Scn, Ur: NOT DETECTED
Opiate, Ur Screen: NOT DETECTED
Phencyclidine (PCP) Ur S: NOT DETECTED
Tricyclic, Ur Screen: NOT DETECTED

## 2021-01-06 LAB — CBC WITH DIFFERENTIAL/PLATELET
Abs Immature Granulocytes: 0.05 10*3/uL (ref 0.00–0.07)
Basophils Absolute: 0 10*3/uL (ref 0.0–0.1)
Basophils Relative: 0 %
Eosinophils Absolute: 0.1 10*3/uL (ref 0.0–0.5)
Eosinophils Relative: 1 %
HCT: 48.7 % (ref 39.0–52.0)
Hemoglobin: 16.9 g/dL (ref 13.0–17.0)
Immature Granulocytes: 1 %
Lymphocytes Relative: 25 %
Lymphs Abs: 1.9 10*3/uL (ref 0.7–4.0)
MCH: 29.6 pg (ref 26.0–34.0)
MCHC: 34.7 g/dL (ref 30.0–36.0)
MCV: 85.3 fL (ref 80.0–100.0)
Monocytes Absolute: 0.8 10*3/uL (ref 0.1–1.0)
Monocytes Relative: 11 %
Neutro Abs: 4.8 10*3/uL (ref 1.7–7.7)
Neutrophils Relative %: 62 %
Platelets: 156 10*3/uL (ref 150–400)
RBC: 5.71 MIL/uL (ref 4.22–5.81)
RDW: 13.2 % (ref 11.5–15.5)
WBC: 7.7 10*3/uL (ref 4.0–10.5)
nRBC: 0 % (ref 0.0–0.2)

## 2021-01-06 LAB — RESP PANEL BY RT-PCR (FLU A&B, COVID) ARPGX2
Influenza A by PCR: NEGATIVE
Influenza B by PCR: NEGATIVE
SARS Coronavirus 2 by RT PCR: NEGATIVE

## 2021-01-06 LAB — BASIC METABOLIC PANEL
Anion gap: 13 (ref 5–15)
Anion gap: 9 (ref 5–15)
Anion gap: 8 (ref 5–15)
BUN: 25 mg/dL — ABNORMAL HIGH (ref 6–20)
BUN: 25 mg/dL — ABNORMAL HIGH (ref 6–20)
BUN: 24 mg/dL — ABNORMAL HIGH (ref 6–20)
CO2: 20 mmol/L — ABNORMAL LOW (ref 22–32)
CO2: 27 mmol/L (ref 22–32)
CO2: 25 mmol/L (ref 22–32)
Calcium: 9 mg/dL (ref 8.9–10.3)
Calcium: 8.3 mg/dL — ABNORMAL LOW (ref 8.9–10.3)
Calcium: 8.5 mg/dL — ABNORMAL LOW (ref 8.9–10.3)
Chloride: 91 mmol/L — ABNORMAL LOW (ref 98–111)
Chloride: 94 mmol/L — ABNORMAL LOW (ref 98–111)
Chloride: 101 mmol/L (ref 98–111)
Creatinine, Ser: 1.52 mg/dL — ABNORMAL HIGH (ref 0.61–1.24)
Creatinine, Ser: 1.4 mg/dL — ABNORMAL HIGH (ref 0.61–1.24)
Creatinine, Ser: 1.23 mg/dL (ref 0.61–1.24)
GFR, Estimated: 57 mL/min — ABNORMAL LOW (ref >60)
GFR, Estimated: >60 mL/min (ref >60)
GFR, Estimated: >60 mL/min (ref >60)
Glucose, Bld: 259 mg/dL — ABNORMAL HIGH (ref 70–99)
Glucose, Bld: 248 mg/dL — ABNORMAL HIGH (ref 70–99)
Glucose, Bld: 142 mg/dL — ABNORMAL HIGH (ref 70–99)
Potassium: 4.3 mmol/L (ref 3.5–5.1)
Potassium: 4.1 mmol/L (ref 3.5–5.1)
Potassium: 4.5 mmol/L (ref 3.5–5.1)
Sodium: 124 mmol/L — ABNORMAL LOW (ref 135–145)
Sodium: 130 mmol/L — ABNORMAL LOW (ref 135–145)
Sodium: 134 mmol/L — ABNORMAL LOW (ref 135–145)

## 2021-01-06 LAB — TROPONIN I (HIGH SENSITIVITY)
Troponin I (High Sensitivity): 13 ng/L (ref <18)
Troponin I (High Sensitivity): 55 ng/L — ABNORMAL HIGH (ref <18)
Troponin I (High Sensitivity): 146 ng/L
Troponin I (High Sensitivity): 244 ng/L (ref <18)
Troponin I (High Sensitivity): 268 ng/L (ref <18)

## 2021-01-06 LAB — OSMOLALITY, URINE: Osmolality, Ur: 114 mOsm/kg — ABNORMAL LOW (ref 300–900)

## 2021-01-06 LAB — MAGNESIUM: Magnesium: 2.2 mg/dL (ref 1.7–2.4)

## 2021-01-06 LAB — CBG MONITORING, ED
Glucose-Capillary: 245 mg/dL — ABNORMAL HIGH (ref 70–99)
Glucose-Capillary: 205 mg/dL — ABNORMAL HIGH (ref 70–99)

## 2021-01-06 LAB — OSMOLALITY: Osmolality: 285 mOsm/kg (ref 275–295)

## 2021-01-06 LAB — PROTIME-INR
INR: 1.4 — ABNORMAL HIGH (ref 0.8–1.2)
Prothrombin Time: 16.7 seconds — ABNORMAL HIGH (ref 11.4–15.2)

## 2021-01-06 LAB — SODIUM, URINE, RANDOM: Sodium, Ur: 20 mmol/L

## 2021-01-06 LAB — GLUCOSE, CAPILLARY
Glucose-Capillary: 206 mg/dL — ABNORMAL HIGH (ref 70–99)
Glucose-Capillary: 150 mg/dL — ABNORMAL HIGH (ref 70–99)

## 2021-01-06 LAB — TSH: TSH: 3.414 u[IU]/mL (ref 0.350–4.500)

## 2021-01-06 LAB — BRAIN NATRIURETIC PEPTIDE: B Natriuretic Peptide: 1638.8 pg/mL — ABNORMAL HIGH (ref 0.0–100.0)

## 2021-01-06 MED ORDER — ACETAMINOPHEN 325 MG PO TABS: 650.0000 mg | ORAL_TABLET | Freq: Four times a day (QID) | ORAL | Status: DC | PRN

## 2021-01-06 MED ORDER — DILTIAZEM HCL 25 MG/5ML IV SOLN
INTRAVENOUS | Status: AC
  Filled 2021-01-06: qty 5

## 2021-01-06 MED ORDER — ENOXAPARIN SODIUM 100 MG/ML ~~LOC~~ SOLN
1.0000 mg/kg | Freq: Two times a day (BID) | SUBCUTANEOUS | Status: DC
  Administered 2021-01-06 – 2021-01-08 (×3): 92.5 mg via SUBCUTANEOUS
  Filled 2021-01-06 (×5): qty 1

## 2021-01-06 MED ORDER — FENTANYL CITRATE (PF) 100 MCG/2ML IJ SOLN
INTRAMUSCULAR | Status: AC
  Administered 2021-01-06: 50 ug via INTRAVENOUS
  Filled 2021-01-06: qty 2

## 2021-01-06 MED ORDER — ADENOSINE 6 MG/2ML IV SOLN
INTRAVENOUS | Status: AC
  Filled 2021-01-06: qty 2

## 2021-01-06 MED ORDER — AZITHROMYCIN 250 MG PO TABS
250.0000 mg | ORAL_TABLET | Freq: Once | ORAL | Status: AC
  Administered 2021-01-06: 250 mg via ORAL
  Filled 2021-01-06: qty 1

## 2021-01-06 MED ORDER — AMIODARONE HCL IN DEXTROSE 360-4.14 MG/200ML-% IV SOLN
30.0000 mg/h | INTRAVENOUS | Status: DC
  Administered 2021-01-06 – 2021-01-09 (×7): 30 mg/h via INTRAVENOUS
  Filled 2021-01-06 (×6): qty 200

## 2021-01-06 MED ORDER — HYDRALAZINE HCL 20 MG/ML IJ SOLN: 5.0000 mg | INTRAMUSCULAR | Status: DC | PRN

## 2021-01-06 MED ORDER — OMEPRAZOLE 20 MG PO CPDR
20.0000 mg | DELAYED_RELEASE_CAPSULE | Freq: Every day | ORAL | Status: DC
  Administered 2021-01-06 – 2021-01-10 (×4): 20 mg via ORAL
  Filled 2021-01-06 (×4): qty 1

## 2021-01-06 MED ORDER — DM-GUAIFENESIN ER 30-600 MG PO TB12: 1.0000 | ORAL_TABLET | Freq: Two times a day (BID) | ORAL | Status: DC | PRN

## 2021-01-06 MED ORDER — MIDAZOLAM HCL 2 MG/2ML IJ SOLN
INTRAMUSCULAR | Status: AC
  Administered 2021-01-06: 4 mg via INTRAVENOUS
  Filled 2021-01-06: qty 4

## 2021-01-06 MED ORDER — ROSUVASTATIN CALCIUM 10 MG PO TABS
10.0000 mg | ORAL_TABLET | Freq: Every day | ORAL | Status: DC
  Administered 2021-01-06 – 2021-01-10 (×5): 10 mg via ORAL
  Filled 2021-01-06 (×5): qty 1

## 2021-01-06 MED ORDER — AMIODARONE HCL IN DEXTROSE 360-4.14 MG/200ML-% IV SOLN
60.0000 mg/h | INTRAVENOUS | Status: AC
  Administered 2021-01-06 (×2): 60 mg/h via INTRAVENOUS
  Filled 2021-01-06 (×2): qty 200

## 2021-01-06 MED ORDER — MIDAZOLAM HCL 2 MG/2ML IJ SOLN: 4.0000 mg | Freq: Once | INTRAMUSCULAR | Status: AC

## 2021-01-06 MED ORDER — DILTIAZEM HCL 25 MG/5ML IV SOLN
15.0000 mg | INTRAVENOUS | Status: AC
  Administered 2021-01-06: 15 mg via INTRAVENOUS

## 2021-01-06 MED ORDER — FENTANYL CITRATE (PF) 100 MCG/2ML IJ SOLN: 50.0000 ug | Freq: Once | INTRAMUSCULAR | Status: AC

## 2021-01-06 MED ORDER — AMIODARONE LOAD VIA INFUSION
150.0000 mg | Freq: Once | INTRAVENOUS | Status: AC
  Administered 2021-01-06: 150 mg via INTRAVENOUS
  Filled 2021-01-06: qty 83.34

## 2021-01-06 MED ORDER — NICOTINE 21 MG/24HR TD PT24
21.0000 mg | MEDICATED_PATCH | Freq: Every day | TRANSDERMAL | Status: DC
  Filled 2021-01-06 (×3): qty 1

## 2021-01-06 MED ORDER — MORPHINE SULFATE (PF) 2 MG/ML IV SOLN: 2.0000 mg | INTRAVENOUS | Status: DC | PRN

## 2021-01-06 MED ORDER — INSULIN GLARGINE 100 UNIT/ML ~~LOC~~ SOLN
5.0000 [IU] | Freq: Every day | SUBCUTANEOUS | Status: DC
  Administered 2021-01-06 – 2021-01-07 (×2): 5 [IU] via SUBCUTANEOUS
  Filled 2021-01-06 (×2): qty 0.05

## 2021-01-06 MED ORDER — DILTIAZEM HCL-DEXTROSE 125-5 MG/125ML-% IV SOLN (PREMIX)
5.0000 mg/h | INTRAVENOUS | Status: DC
  Administered 2021-01-06: 5 mg/h via INTRAVENOUS
  Filled 2021-01-06: qty 125

## 2021-01-06 MED ORDER — SODIUM CHLORIDE 0.9 % IV BOLUS
1000.0000 mL | Freq: Once | INTRAVENOUS | Status: AC
  Administered 2021-01-06: 1000 mL via INTRAVENOUS

## 2021-01-06 MED ORDER — ALBUTEROL SULFATE HFA 108 (90 BASE) MCG/ACT IN AERS
2.0000 | INHALATION_SPRAY | RESPIRATORY_TRACT | Status: DC | PRN
  Filled 2021-01-06: qty 6.7

## 2021-01-06 MED ORDER — ONDANSETRON HCL 4 MG/2ML IJ SOLN: 4.0000 mg | Freq: Three times a day (TID) | INTRAMUSCULAR | Status: DC | PRN

## 2021-01-06 MED ORDER — ADENOSINE 12 MG/4ML IV SOLN
INTRAVENOUS | Status: AC
  Filled 2021-01-06: qty 4

## 2021-01-06 MED ORDER — DIGOXIN 0.25 MG/ML IJ SOLN
0.5000 mg | Freq: Once | INTRAMUSCULAR | Status: AC
  Administered 2021-01-06: 0.5 mg via INTRAVENOUS
  Filled 2021-01-06: qty 2

## 2021-01-06 MED ORDER — MAGNESIUM OXIDE 400 (241.3 MG) MG PO TABS
400.0000 mg | ORAL_TABLET | Freq: Every day | ORAL | Status: DC
  Administered 2021-01-06 – 2021-01-10 (×5): 400 mg via ORAL
  Filled 2021-01-06 (×5): qty 1

## 2021-01-06 MED ORDER — APIXABAN 5 MG PO TABS: 5.0000 mg | ORAL_TABLET | Freq: Two times a day (BID) | ORAL | Status: DC

## 2021-01-06 MED ORDER — FUROSEMIDE 20 MG PO TABS
20.0000 mg | ORAL_TABLET | Freq: Every day | ORAL | Status: DC
  Administered 2021-01-06 – 2021-01-07 (×2): 20 mg via ORAL
  Filled 2021-01-06 (×2): qty 1

## 2021-01-06 MED ORDER — INSULIN ASPART 100 UNIT/ML ~~LOC~~ SOLN
0.0000 [IU] | Freq: Every day | SUBCUTANEOUS | Status: DC
  Administered 2021-01-07: 4 [IU] via SUBCUTANEOUS
  Administered 2021-01-08: 2 [IU] via SUBCUTANEOUS
  Administered 2021-01-09: 4 [IU] via SUBCUTANEOUS
  Filled 2021-01-06 (×3): qty 1

## 2021-01-06 MED ORDER — INSULIN ASPART 100 UNIT/ML ~~LOC~~ SOLN
0.0000 [IU] | Freq: Three times a day (TID) | SUBCUTANEOUS | Status: DC
  Administered 2021-01-06 – 2021-01-08 (×7): 3 [IU] via SUBCUTANEOUS
  Administered 2021-01-08: 7 [IU] via SUBCUTANEOUS
  Administered 2021-01-09: 5 [IU] via SUBCUTANEOUS
  Administered 2021-01-09: 3 [IU] via SUBCUTANEOUS
  Administered 2021-01-09: 1 [IU] via SUBCUTANEOUS
  Administered 2021-01-10: 5 [IU] via SUBCUTANEOUS
  Administered 2021-01-10: 2 [IU] via SUBCUTANEOUS
  Filled 2021-01-06 (×13): qty 1

## 2021-01-06 NOTE — Progress Notes (Signed)
   01/06/21 1508  Assess: MEWS Score  Temp 97.7 F (36.5 C)  BP (!) 117/91  Pulse Rate (!) 115  Resp 16  SpO2 98 %  O2 Device Room Air  Patient Activity (if Appropriate) In bed  Assess: MEWS Score  MEWS Temp 0  MEWS Systolic 0  MEWS Pulse 2  MEWS RR 0  MEWS LOC 0  MEWS Score 2  MEWS Score Color Yellow  Assess: if the MEWS score is Yellow or Red  Were vital signs taken at a resting state? Yes  Focused Assessment No change from prior assessment  Early Detection of Sepsis Score *See Row Information* Low  MEWS guidelines implemented *See Row Information* Yes  Treat  MEWS Interventions Administered scheduled meds/treatments  Pain Scale 0-10  Pain Score 0  Take Vital Signs  Increase Vital Sign Frequency  Yellow: Q 2hr X 2 then Q 4hr X 2, if remains yellow, continue Q 4hrs  Escalate  MEWS: Escalate Yellow: discuss with charge nurse/RN and consider discussing with provider and RRT  Notify: Charge Nurse/RN  Name of Charge Nurse/RN Notified Mardella Layman, RN  Date Charge Nurse/RN Notified 01/06/21  Time Charge Nurse/RN Notified 1521  Notify: Provider  Provider Name/Title Niu  Date Provider Notified 01/06/21  Time Provider Notified 1528  Notification Type Page  Notification Reason Other (Comment) (Elevated HR and BP)  Provider response See new orders  Date of Provider Response 01/06/21  Time of Provider Response 1530  Document  Patient Outcome Stabilized after interventions  Progress note created (see row info) Yes

## 2021-01-06 NOTE — ED Notes (Signed)
Dr. York Cerise at bedside updating pt and pt's girlfriend

## 2021-01-06 NOTE — ED Notes (Signed)
Lab called and states they need a new blue top d/t first not being filled completely.

## 2021-01-06 NOTE — ED Notes (Addendum)
Dr. York Cerise at bedside. Pt returned to SVT, then transitioned into A. Fib. Pt remains alert, denies pain.

## 2021-01-06 NOTE — Plan of Care (Signed)

## 2021-01-06 NOTE — ED Triage Notes (Signed)
Acute onset chest pain described as sharp shooting and pressure. L sided without radiation. Pt with noted SOB as well. Tachy on monitor 217 bpm. Recent admission for CHF. MD made aware

## 2021-01-06 NOTE — ED Notes (Addendum)
Pt brought to room directly from triage following EKG. Dr. York Cerise and multiple RNs to room.

## 2021-01-06 NOTE — H&P (Addendum)
History and Physical    George Shaw BDZ:[id number] DOB: September 24, 1971 DOA: 01/06/2021  Referring MD/NP/PA:   PCP: Lorenso Quarry, NP   Patient coming from:  The patient is coming from home.  At baseline, pt is independent for most of ADL.        Chief Complaint: Palpitation, chest pain and SOB  HPI: George Shaw is a 50 y.o. male with medical history significant of recent admission due to new A fib with RVR on Eliquis, and sCHF with EF 25-30%, HTN, HLD, DM, GERD, CKD-IIIa, tobacco abuse, who present with palpitation, chest pain and SOB.  Patient was recently hospitalized from 3/13-3/17 due to new onset atrial fibrillation with RVR and new lead diagnosed systolic CHF with EF 25-30%.  Patient was discharged on Eliquis.  Patient states that his symptoms started in early morning at about 3 AM.  He has palpitation, heart racing, chest pain or shortness of breath.  He has dry cough, but no fever or chills.  Patient was found to have tachycardia with heart rate up to 217 in ED. EKG showed SVT. Patient was given Versed and cardioverted converted by EDP.  Patient had hypotension with blood pressure 74/64, which improved to 100/70.  Currently patient is in atrial fibrillation with RVR with heart rate 120-140s.  Cardizem drip started.  His chest pain has resolved.  Shortness breath has improved.  Patient does not have diarrhea, nausea, vomiting or abdominal pain.  No symptoms of UTI.  No leg edema.  Patient states that he used to drink alcohol heavily, but stopped drinking 3 weeks ago.  Patient states that she is taking azithromycin in the past 4 days per his PCP for possible pneumonia. He needs 1 more dose today to complete the course.  ED Course: pt was found to have negative Covid PCR, BNP 1638, WBC 7.7, troponin level 13 -->55, INR 1.4, stable renal function, temperature 96.3, RR 8, 32, 17, oxygen saturation 91-99% on room air.  Patient is admitted to progressive bed as inpatient.  Dr. Juliann Pares of  cardiology is consulted.  Review of Systems:   General: no fevers, chills, no body weight gain, has fatigue HEENT: no blurry vision, hearing changes or sore throat Respiratory: has dyspnea, coughing, no wheezing CV: has chest pain,  palpitations GI: no nausea, vomiting, abdominal pain, diarrhea, constipation GU: no dysuria, burning on urination, increased urinary frequency, hematuria  Ext: no leg edema Neuro: no unilateral weakness, numbness, or tingling, no vision change or hearing loss Skin: no rash, no skin tear. MSK: No muscle spasm, no deformity, no limitation of range of movement in spin Heme: No easy bruising.  Travel history: No recent long distant travel.  Allergy: No Known Allergies  Past Medical History:  Diagnosis Date  . Arrhythmia    atrial fibrillation  . CHF (congestive heart failure) (HCC)   . Diabetes mellitus without complication (HCC)   . Hypertension     History reviewed. No pertinent surgical history.  Social History:  reports that he has been smoking cigars. He has never used smokeless tobacco. He reports previous alcohol use. He reports previous drug use.  Family History:  Family History  Problem Relation Age of Onset  . Diabetes Mellitus II Mother   . Diabetes Mellitus II Father      Prior to Admission medications   Medication Sig Start Date End Date Taking? Authorizing Provider  apixaban (ELIQUIS) 5 MG TABS tablet Take 1 tablet (5 mg total) by mouth 2 (two) times daily. 12/27/20  Lewie Chamber, MD  diltiazem (CARDIZEM CD) 360 MG 24 hr capsule Take 1 capsule (360 mg total) by mouth daily. 12/28/20   Lewie Chamber, MD  furosemide (LASIX) 20 MG tablet Take 1 tablet (20 mg total) by mouth daily. 12/27/20 12/27/21  Lewie Chamber, MD  JANUVIA 100 MG tablet Take 100 mg by mouth daily. 12/10/20   [provider]  metFORMIN (GLUCOPHAGE-XR) 500 MG 24 hr tablet SMARTSIG:2 Tablet(s) By Mouth Every Evening 12/11/20   [provider]   metoprolol tartrate (LOPRESSOR) 100 MG tablet Take 1 tablet (100 mg total) by mouth 2 (two) times daily. Patient taking differently: Take 50 mg by mouth 2 (two) times daily. 12/27/20   Lewie Chamber, MD  omeprazole (PRILOSEC OTC) 20 MG tablet Take 20 mg by mouth daily.    [provider]  rosuvastatin (CRESTOR) 10 MG tablet Take 10 mg by mouth daily. 12/11/20   [provider]    Physical Exam: Vitals:   01/06/21 1215 01/06/21 1230 01/06/21 1300 01/06/21 1330  BP: (!) 118/98 (!) 120/103 (!) 127/95 (!) 125/102  Pulse: (!) 114 (!) 114 (!) 115 (!) 114  Resp: 17 15  17   Temp:      TempSrc:      SpO2: 95% 95% 96% 99%  Weight:      Height:       General: Not in acute distress HEENT:       Eyes: PERRL, EOMI, no scleral icterus.       ENT: No discharge from the ears and nose, no pharynx injection, no tonsillar enlargement.        Neck: No JVD, no bruit, no mass felt. Heme: No neck lymph node enlargement. Cardiac: S1/S2, regularly irregular rhythm, No murmurs, No gallops or rubs. Respiratory: No rales, wheezing, rhonchi or rubs. GI: Soft, nondistended, nontender, no rebound pain, no organomegaly, BS present. GU: No hematuria Ext: No pitting leg edema bilaterally. 1+DP/PT pulse bilaterally. Musculoskeletal: No joint deformities, No joint redness or warmth, no limitation of ROM in spin. Skin: No rashes.  Neuro: Alert, oriented X3, cranial nerves II-XII grossly intact, moves all extremities normally. Psych: Patient is not psychotic, no suicidal or hemocidal ideation.  Labs on Admission: I have personally reviewed following labs and imaging studies  CBC: Recent Labs  Lab 01/06/21 0416  WBC 7.7  NEUTROABS 4.8  HGB 16.9  HCT 48.7  MCV 85.3  PLT 156   Basic Metabolic Panel: Recent Labs  Lab 01/06/21 0416 01/06/21 0755  NA 124* 130*  K 4.3 4.1  CL 91* 94*  CO2 20* 27  GLUCOSE 259* 248*  BUN 25* 25*  CREATININE 1.52* 1.40*  CALCIUM 9.0 8.3*  MG 2.2  --     GFR: Estimated Creatinine Clearance: 80.1 mL/min (A) (by C-G formula based on SCr of 1.4 mg/dL (H)). Liver Function Tests: No results for input(s): AST, ALT, ALKPHOS, BILITOT, PROT, ALBUMIN in the last 168 hours. No results for input(s): LIPASE, AMYLASE in the last 168 hours. No results for input(s): AMMONIA in the last 168 hours. Coagulation Profile: Recent Labs  Lab 01/06/21 0457  INR 1.4*   Cardiac Enzymes: No results for input(s): CKTOTAL, CKMB, CKMBINDEX, TROPONINI in the last 168 hours. BNP (last 3 results) No results for input(s): PROBNP in the last 8760 hours. HbA1C: No results for input(s): HGBA1C in the last 72 hours. CBG: Recent Labs  Lab 01/06/21 0757 01/06/21 1128  GLUCAP 245* 205*   Lipid Profile: No results for input(s): CHOL,  HDL, LDLCALC, TRIG, CHOLHDL, LDLDIRECT in the last 72 hours. Thyroid Function Tests: Recent Labs    01/06/21 0755  TSH 3.414   Anemia Panel: No results for input(s): VITAMINB12, FOLATE, FERRITIN, TIBC, IRON, RETICCTPCT in the last 72 hours. Urine analysis:    Component Value Date/Time   COLORURINE YELLOW 12/27/2013 1842   APPEARANCEUR HAZY 12/27/2013 1842   LABSPEC >=1.030 12/27/2013 1842   PHURINE 6.0 12/27/2013 1842   GLUCOSEU NEGATIVE 12/27/2013 1842   HGBUR TRACE 12/27/2013 1842   BILIRUBINUR NEGATIVE 12/27/2013 1842   KETONESUR NEGATIVE 12/27/2013 1842   PROTEINUR TRACE 12/27/2013 1842   NITRITE NEGATIVE 12/27/2013 1842   LEUKOCYTESUR TRACE 12/27/2013 1842   Sepsis Labs: @LABRCNTIP (procalcitonin:4,lacticidven:4) ) Recent Results (from the past 240 hour(s))  Resp Panel by RT-PCR (Flu A&B, Covid) Nasopharyngeal Swab     Status: None   Collection Time: 01/06/21  6:46 AM   Specimen: Nasopharyngeal Swab; Nasopharyngeal(NP) swabs in vial transport medium  Result Value Ref Range Status   SARS Coronavirus 2 by RT PCR NEGATIVE NEGATIVE Final    Comment: (NOTE) SARS-CoV-2 target nucleic acids are NOT DETECTED.  The  SARS-CoV-2 RNA is generally detectable in upper respiratory specimens during the acute phase of infection. The lowest concentration of SARS-CoV-2 viral copies this assay can detect is 138 copies/mL. A negative result does not preclude SARS-Cov-2 infection and should not be used as the sole basis for treatment or other patient management decisions. A negative result may occur with  improper specimen collection/handling, submission of specimen other than nasopharyngeal swab, presence of viral mutation(s) within the areas targeted by this assay, and inadequate number of viral copies(<138 copies/mL). A negative result must be combined with clinical observations, patient history, and epidemiological information. The expected result is Negative.  Fact Sheet for Patients:  01/08/21  Fact Sheet for Healthcare Providers:  BloggerCourse.com  This test is no t yet approved or cleared by the [employer] FDA and  has been authorized for detection and/or diagnosis of SARS-CoV-2 by FDA under an Emergency Use Authorization (EUA). This EUA will remain  in effect (meaning this test can be used) for the duration of the COVID-19 declaration under Section 564(b)(1) of the Act, 21 U.S.C.section 360bbb-3(b)(1), unless the authorization is terminated  or revoked sooner.       Influenza A by PCR NEGATIVE NEGATIVE Final   Influenza B by PCR NEGATIVE NEGATIVE Final    Comment: (NOTE) The Xpert Xpress SARS-CoV-2/FLU/RSV plus assay is intended as an aid in the diagnosis of influenza from Nasopharyngeal swab specimens and should not be used as a sole basis for treatment. Nasal washings and aspirates are unacceptable for Xpert Xpress SARS-CoV-2/FLU/RSV testing.  Fact Sheet for Patients: [place]  Fact Sheet for Healthcare Providers: BloggerCourse.com  This test is not yet approved or  cleared by the [employer].[place] FDA and has been authorized for detection and/or diagnosis of SARS-CoV-2 by FDA under an Emergency Use Authorization (EUA). This EUA will remain in effect (meaning this test can be used) for the duration of the COVID-19 declaration under Section 564(b)(1) of the Act, 21 U.S.C. section 360bbb-3(b)(1), unless the authorization is terminated or revoked.  Performed at [facility], [address], [place], [place]      Radiological Exams on Admission: DG Chest [facility] 1 View  Result Date: 01/06/2021 CLINICAL DATA:  Chest pain, AFib EXAM: PORTABLE CHEST 1 VIEW COMPARISON:  12/23/2020 FINDINGS: Mild right basilar opacity, new, atelectasis versus pneumonia. No pleural effusion or pneumothorax.  The heart is top-normal in size. IMPRESSION: Mild right basilar opacity, new, atelectasis versus pneumonia. Electronically Signed   By: Charline Bills M.D.   On: 01/06/2021 09:02     EKG: I have personally reviewed.  First the EKG showed SVT with heart rate 217, right axis deviation, poor R wave progression; the second EKG showed similar pattern.  The third EKG showed sinus rhythm with 2 episode of nonsustained V. Tach.  The fourth EKG showed atrial fibrillation with heart rate 148, QTC 490.  Assessment/Plan Principal Problem:   Atrial fibrillation with RVR (HCC) Active Problems:   Chronic renal failure, stage 3a (HCC)   Type II diabetes mellitus with renal manifestations (HCC)   HTN (hypertension)   HLD (hyperlipidemia)   GERD (gastroesophageal reflux disease)   Chronic systolic CHF (congestive heart failure) (HCC)   Tobacco abuse   Chest pain   Hyponatremia   Elevated troponin   Hypotension   CAP (community acquired pneumonia)   Atrial fibrillation with RVR: Currently patient is in atrial fibrillation with RVR.  Patient initially had SVT with heart rate up to 217, cardio converted by EDP.  Initially hypotensive which improved.  Currently  patient is on Cardizem drip with heart rate 120-140s.  Blood pressure 100/70 currently.  Dr. Code of cardiology is consulted  -Admitted to progressive bed as inpatient -Continue Cardizem drip --> switch to amiodarone drip -Patient was given 0.5 mg of digoxin -Continue Eliquis -hold Cardizem and metoprolol due to soft blood pressure  HTN (hypertension) --Hold Cardizem and metoprolol due to soft blood pressure -Patient is on Lasix -IV hydralazine as needed  Chronic renal failure, stage 3a (HCC): Stable -Follow-up with BMP  Type II diabetes mellitus with renal manifestations (HCC): Recent A1c 7.3, poorly controlled.  Patient is taking Metformin and Januvia.  Patient is supposed to start taking 10 units of Lantus daily, but not started yet -Sliding scale insulin -Start Lantus 5 unit daily  HLD (hyperlipidemia) -Crestor  GERD (gastroesophageal reflux disease) -Protonix  Chronic systolic CHF (congestive heart failure) (HCC): 2D echo on 12/24/2020 showed EF of 25-30%.  Patient has elevated BNP at 1638, but no leg edema or JVD.  Does not seem to have CHF exacerbation. -Continue Lasix 20 mg daily  Tobacco abuse -Nicotine patch  Chest pain and elevated troponin: Has resolved.  Troponin 13 -->, 55.  Most likely due to demand ischemia secondary to tachycardia. -Trend troponin -On Crestor -Will not start aspirin since patient is on Eliquis -check FLP -recent A1c 7.3 on 12/24/20 -f/u CXR  Hypotension: resolved. This is due to unstable tachycardia -Monitor blood pressure closely  Hyponatremia: Sodium 124.  Mental status normal.  Most likely due to dehydration and continuation of Lasix.  Other differential diagnosis include SIADH, potomania, thyroid dysfunction  - Will check urine sodium, urine osmolality, serum osmolality. - check TSH - Fluid restriction - IVF: 1L NS in ED - f/u by BMP q8h - avoid over correction too fast due to risk of central pontine myelinolysis  Possible  pneumonia: Chest x-ray showed mild right basilar infiltration.  Patient is currently taking azithromycin.  He has mild cough, no fever or leukocytosis. -Give 1 more dose of azithromycin to complete 5-day course of treatment  DVT ppx: on Eliquis Code Status: Full code Family Communication:  Yes, patient's girlfriend at bed side Disposition Plan:  Anticipate discharge back to previous environment Consults called: Dr. Juliann Pares of cardiology Admission status and Level of care: Progressive Cardiac:    progressive unit  as inpt       Status is: Inpatient  Remains inpatient appropriate because:Inpatient level of care appropriate due to severity of illness   Dispo: The patient is from: Home              Anticipated d/c is to: Home              Patient currently is not medically stable to d/c.   Difficult to place patient No          Date of Service 01/06/2021    Lorretta HarpXilin Hommer Cunliffe [facility]   If 7PM-7AM, please contact night-coverage [email or web address] 01/06/2021, 3:06 PM

## 2021-01-06 NOTE — ED Provider Notes (Signed)
[facility] Emergency Department Provider Note  ____________________________________________   Event Date/Time   First MD Initiated Contact with Patient 01/06/21 0402     (approximate)  I have reviewed the triage vital signs and the nursing notes.   HISTORY  Chief Complaint Chest Pain  Level 5 caveat:  history/ROS limited by acute/critical illness  HPI George Shaw is a 50 y.o. male with a recent diagnosis (within the last 2 weeks) of atrial fibrillation as well as congestive heart failure who is currently taking Eliquis and presents for evaluation of acute onset and severe chest pain and palpitations.  He said he felt totally normal last night and had a good day yesterday.  No drugs, no alcohol, no caffeine, no other new medications other than what he was started on at the time of hospital discharge about 10 days ago.  He awoke at approximately 3 AM with severe sharp and heavy chest pain and a sensation of palpitations.  He is also having some associated shortness of breath.  Nothing in particular makes the symptoms better or worse and he knew he needed to come to the emergency department.  Patient denies recent fever, abdominal pain, nausea, vomiting, and syncope.         Past Medical History:  Diagnosis Date  . Arrhythmia    atrial fibrillation  . CHF (congestive heart failure) (HCC)   . Diabetes mellitus without complication (HCC)   . Hypertension     Patient Active Problem List   Diagnosis Date Noted  . Atrial fibrillation with RVR (HCC) 01/06/2021  . Type II diabetes mellitus with renal manifestations (HCC) 01/06/2021  . HTN (hypertension) 01/06/2021  . HLD (hyperlipidemia) 01/06/2021  . GERD (gastroesophageal reflux disease) 01/06/2021  . Chronic systolic CHF (congestive heart failure) (HCC) 01/06/2021  . Tobacco abuse 01/06/2021  . Chest pain 01/06/2021  . Hyponatremia 01/06/2021  . Elevated troponin 01/06/2021  . Hypotension  01/06/2021  . Acute systolic CHF (congestive heart failure) (HCC) 12/24/2020  . Diabetes mellitus type 2, controlled, without complications (HCC) 12/24/2020  . Hypokalemia 12/24/2020  . Chronic renal failure, stage 3a (HCC) 12/24/2020  . Atrial fibrillation with rapid ventricular response (HCC) 12/23/2020    History reviewed. No pertinent surgical history.  Prior to Admission medications   Medication Sig Start Date End Date Taking? Authorizing Provider  apixaban (ELIQUIS) 5 MG TABS tablet Take 1 tablet (5 mg total) by mouth 2 (two) times daily. 12/27/20   Lewie Chamber, MD  diltiazem (CARDIZEM CD) 360 MG 24 hr capsule Take 1 capsule (360 mg total) by mouth daily. 12/28/20   Lewie Chamber, MD  furosemide (LASIX) 20 MG tablet Take 1 tablet (20 mg total) by mouth daily. 12/27/20 12/27/21  Lewie Chamber, MD  JANUVIA 100 MG tablet Take 100 mg by mouth daily. 12/10/20   [provider]  metFORMIN (GLUCOPHAGE-XR) 500 MG 24 hr tablet SMARTSIG:2 Tablet(s) By Mouth Every Evening 12/11/20   [provider]  metoprolol tartrate (LOPRESSOR) 100 MG tablet Take 1 tablet (100 mg total) by mouth 2 (two) times daily. Patient taking differently: Take 50 mg by mouth 2 (two) times daily. 12/27/20   Lewie Chamber, MD  omeprazole (PRILOSEC OTC) 20 MG tablet Take 20 mg by mouth daily.    [provider]  rosuvastatin (CRESTOR) 10 MG tablet Take 10 mg by mouth daily. 12/11/20   [provider]    Allergies Patient has no known allergies.  History reviewed. No pertinent family history.  Social History Social History   Tobacco Use  . Smoking status: Light Tobacco Smoker    Types: Cigars  . Smokeless tobacco: Never Used  Substance Use Topics  . Alcohol use: Not Currently  . Drug use: Not Currently    Review of Systems Level 5 caveat:  history/ROS limited by acute/critical illness   Constitutional: No fever/chills Eyes: No visual changes. ENT: No sore  throat. Cardiovascular: Positive for chest pain and palpitations. Respiratory: Positive for shortness of breath. Gastrointestinal: No abdominal pain.  No nausea, no vomiting.  No diarrhea.  No constipation. Genitourinary: Negative for dysuria. Musculoskeletal: Negative for neck pain.  Negative for back pain. Integumentary: Negative for rash. Neurological: Negative for headaches, focal weakness or numbness.   ____________________________________________   PHYSICAL EXAM:  VITAL SIGNS: ED Triage Vitals  Enc Vitals Group     BP 01/06/21 0410 (!) 124/96     Pulse Rate 01/06/21 0406 (!) 218     Resp 01/06/21 0406 (!) 32     Temp 01/06/21 0410 (!) 96.3 F (35.7 C)     Temp Source 01/06/21 0406 Oral     SpO2 01/06/21 0406 98 %     Weight 01/06/21 0405 103.4 kg (227 lb 15.3 oz)     Height 01/06/21 0405 1.93 m (6\' 4" )     Head Circumference --      Peak Flow --      Pain Score 01/06/21 0404 10     Pain Loc --      Pain Edu? --      Excl. in GC? --     Constitutional: Alert and oriented.  Moderate distress. Eyes: Conjunctivae are normal.  Head: Atraumatic. Nose: No congestion/rhinnorhea. Mouth/Throat: Patient is wearing a mask. Neck: No stridor.  No meningeal signs.   Cardiovascular: Severe tachycardia with regular rhythm.  Good peripheral perfusion in spite of the rate. Respiratory: Increased respiratory rate, good air movement, no wheezes, rales, nor rhonchi. Gastrointestinal: Soft and nontender. No distention.  Musculoskeletal: No lower extremity tenderness nor edema. No gross deformities of extremities. Neurologic:  Normal speech and language. No gross focal neurologic deficits are appreciated.  Skin:  Skin is warm, dry and intact. Psychiatric: Mood and affect are appropriate under the circumstances.  ____________________________________________   LABS (all labs ordered are listed, but only abnormal results are displayed)  Labs Reviewed  BASIC METABOLIC PANEL -  Abnormal; Notable for the following components:      Result Value   Sodium 124 (*)    Chloride 91 (*)    CO2 20 (*)    Glucose, Bld 259 (*)    BUN 25 (*)    Creatinine, Ser 1.52 (*)    GFR, Estimated 57 (*)    All other components within normal limits  PROTIME-INR - Abnormal; Notable for the following components:   Prothrombin Time 16.7 (*)    INR 1.4 (*)    All other components within normal limits  TROPONIN I (HIGH SENSITIVITY) - Abnormal; Notable for the following components:   Troponin I (High Sensitivity) 55 (*)    All other components within normal limits  RESP PANEL BY RT-PCR (FLU A&B, COVID) ARPGX2  CBC WITH DIFFERENTIAL/PLATELET  MAGNESIUM  BRAIN NATRIURETIC PEPTIDE  BASIC METABOLIC PANEL  BASIC METABOLIC PANEL  BASIC METABOLIC PANEL  OSMOLALITY, URINE  OSMOLALITY  SODIUM, URINE, RANDOM  TSH  URINE DRUG SCREEN, QUALITATIVE ([facility] ONLY)  TROPONIN I (HIGH SENSITIVITY)  TROPONIN I (HIGH SENSITIVITY)   ____________________________________________  EKG  ED ECG REPORT #1 I, Loleta Rose, the attending physician, personally viewed and interpreted this ECG.  Date: 01/06/2021 EKG Time: 03:59 Rate: 217 Rhythm: SVT vs a-flutter QRS Axis: normal Intervals: abnormal due to SVT versus a flutter ST/T Wave abnormalities: Marked ST depression globally likely due to elevated rate. Narrative Interpretation: Probable rate related ischemia.  ED ECG REPORT #2 I, Loleta Rose, the attending physician, personally viewed and interpreted this ECG.  Date: 01/06/2021 EKG Time: 04:03 Rate: 214 Rhythm: SVT vs a-flutter QRS Axis: normal Intervals: abnormal due to SVT versus a flutter ST/T Wave abnormalities: Marked ST depression globally likely due to elevated rate. Narrative Interpretation: Probable rate related ischemia.  ED ECG REPORT #3 I, Loleta Rose, the attending physician, personally viewed and interpreted this ECG.  Date: 01/06/2021 EKG Time: 4:19 AM Rate:  95 Rhythm: normal sinus rhythm with occasional PVC QRS Axis: normal Intervals: normal ST/T Wave abnormalities: Non-specific ST segment / T-wave changes, but no clear evidence of acute ischemia. Narrative Interpretation: no definitive evidence of acute ischemia; does not meet STEMI criteria.  ED ECG REPORT #4 I, Loleta Rose, the attending physician, personally viewed and interpreted this ECG.  Date: 01/06/2021 EKG Time: 4:50 AM Rate: 148 Rhythm: A. fib with RVR QRS Axis: normal Intervals: Abnormal due to A. fib, otherwise unremarkable ST/T Wave abnormalities: Non-specific ST segment / T-wave changes, but no clear evidence of acute ischemia. Narrative Interpretation: no definitive evidence of acute ischemia; does not meet STEMI criteria.      ____________________________________________    PROCEDURES   Procedure(s) performed (including Critical Care):  .Critical Care Performed by: Loleta Rose, MD Authorized by: Loleta Rose, MD   Critical care provider statement:    Critical care time (minutes):  60   Critical care time was exclusive of:  Separately billable procedures and treating other patients   Critical care was necessary to treat or prevent imminent or life-threatening deterioration of the following conditions:  Circulatory failure   Critical care was time spent personally by me on the following activities:  Development of treatment plan with patient or surrogate, discussions with consultants, evaluation of patient's response to treatment, examination of patient, obtaining history from patient or surrogate, ordering and performing treatments and interventions, ordering and review of laboratory studies, ordering and review of radiographic studies, pulse oximetry, re-evaluation of patient's condition and review of old charts .Cardioversion  Date/Time: 01/06/2021 4:20 AM Performed by: Loleta Rose, MD Authorized by: Loleta Rose, MD   Consent:    Consent obtained:   Emergent situation and verbal   Consent given by:  Patient   Risks discussed:  Induced arrhythmia, death and pain   Alternatives discussed:  Rate-control medication Pre-procedure details:    Cardioversion basis:  Emergent   Rhythm:  Supraventricular tachycardia   Electrode placement:  Anterior-posterior Patient sedated: Yes. Refer to sedation procedure documentation for details of sedation.  Attempt one:    Cardioversion mode:  Synchronous   Waveform:  Biphasic   Shock (Joules):  100   Shock outcome:  Conversion to normal sinus rhythm Post-procedure details:    Patient status:  Awake   Patient tolerance of procedure:  Tolerated well, no immediate complications .Sedation  Date/Time: 01/06/2021 4:20 AM Performed by: Loleta Rose, MD Authorized by: Loleta Rose, MD   Consent:    Consent obtained:  Emergent situation and verbal   Consent given by:  Patient   Risks discussed:  Dysrhythmia, inadequate sedation, nausea and vomiting   Alternatives discussed:  Analgesia without sedation  Universal protocol:    Immediately prior to procedure, a time out was called: yes   Indications:    Procedure performed:  Cardioversion   Procedure necessitating sedation performed by:  Physician performing sedation Pre-sedation assessment:    Time since last food or drink:  Hours   NPO status caution: urgency dictates proceeding with non-ideal NPO status     ASA classification: class 3 - patient with severe systemic disease     Mallampati score:  II - soft palate, uvula, fauces visible   Neck mobility: normal     Pre-sedation assessments completed and reviewed: airway patency, cardiovascular function, hydration status, mental status, nausea/vomiting, pain level, respiratory function and temperature     Pre-sedation assessment completed:  01/06/2021 4:20 AM Immediate pre-procedure details:    Reassessment: Patient reassessed immediately prior to procedure     Reviewed: vital signs and relevant  labs/tests     Verified: bag valve mask available, emergency equipment available, intubation equipment available, IV patency confirmed and oxygen available   Procedure details (see MAR for exact dosages):    Preoxygenation:  Room air   Sedation:  Midazolam   Intended level of sedation: moderate (conscious sedation)   Analgesia:  Fentanyl   Intra-procedure monitoring:  Blood pressure monitoring, cardiac monitor, frequent LOC assessments and frequent vital sign checks   Intra-procedure events: none     Total Provider sedation time (minutes):  15 Post-procedure details:    Post-sedation assessment completed:  01/06/2021 4:45 AM   Attendance: Constant attendance by certified staff until patient recovered     Recovery: Patient returned to pre-procedure baseline     Post-sedation assessments completed and reviewed: airway patency, cardiovascular function, hydration status, mental status, nausea/vomiting, pain level, respiratory function and temperature     Patient is stable for discharge or admission: yes     Procedure completion:  Tolerated well, no immediate complications     ____________________________________________   INITIAL IMPRESSION / MDM / ASSESSMENT AND PLAN / ED COURSE  As part of my medical decision making, I reviewed the following data within the electronic MEDICAL RECORD NUMBER Nursing notes reviewed and incorporated, Labs reviewed , EKG interpreted , Old EKG reviewed, Old chart reviewed, Discussed with admitting physician  and Notes from prior ED visits   Differential diagnosis includes, but is not limited to, A. fib with RVR, a flutter, SVT, AVNRT, electrolyte or metabolic abnormality, ACS, much less likely infectious process.  The patient is on the cardiac monitor to evaluate for evidence of arrhythmia and/or significant heart rate changes.  Patient is in moderate distress upon arrival.  Fortunately his initial blood pressure was essentially normal, but I was extremely  concerned about his heart rate of about 217, his associated severe chest pain and respiratory distress, and I was concerned that he would worsen rapidly if allowed to continue at such a rate.  I quickly reviewed his medical record and saw that he had a heart rate that was substantially elevated but not nearly this high when he last came to the emergency department, and even though it was not as high, it still did not respond quickly to diltiazem.  Fortunately he is on Eliquis currently and says he has not missed a dose.  As a result I had a discussion of risks and benefits to electrical cardioversion with the patient and he consented given the emergent nature of the situation.  I provided fentanyl 50 mcg IV and midazolam 4 mg IV and perform electrical synchronized cardioversion with  100 J.  He converted back to normal sinus rhythm.  He remains substantially sedated but had no adverse events such as hypoxemia although he was borderline until he was stimulated and reminded to take deep breaths.  He was able to talk again when I stepped out of the procedure.  His blood pressure, however, is considerably lower.  He is getting a 1 L IV fluid bolus but his blood pressures sometimes are down in the 80s systolic and other times would be around 100 systolic.  I will continue to monitor.  My preference would be to give him a dose of diltiazem but I feel that his pressure is too low currently to do that.  The patient is asymptomatic and feeling much better, still slightly sleepy from sedation but otherwise well.  We will monitor to see if he stays in normal sinus.  Labs are pending.     Clinical Course as of 01/06/21 0750  Wynelle Link Jan 06, 2021  0501 Patient says he feels much better but unfortunately his heart rate again elevated to the 130-150 range and is quite irregular, as opposed to the last time.  However he said he does not feel bad and he is no longer having any chest pain.  We are getting multiple blood  pressure measurements that are low but the patient is completely asymptomatic other than still a little bit sleepy from the Versed/fentanyl.  Finally we got a normotensive blood pressure after switching arms and adjusting the cuff at about 105/78.  I feel that the appropriate next step is diltiazem 15 mg IV; he does not meet criteria for cardioversion because I do not believe he is actually hypertensive and he is asymptomatic, and it would also not be appropriate to again provide procedural sedation.  He agrees with the plan for the diltiazem 15 mg IV.  [CF]  0517 The patient's heart rate is still around 130 but his blood pressure seems to be lower around 85 systolic.  However he remains completely asymptomatic.  Fluid bolus is continuing.  I will continue to monitor but I do not think that additional intervention at this time would be advisable.  He does not need to be cardioverted at this time. [CF]  0602 Updated the patient as to the plan.  His heart rate continues to be in the 130s and his blood pressure is borderline hypotensive.  However he remains asymptomatic.  Given that he is on a diltiazem infusion with a poorly controlled rate, he needs to be admitted.  Labs are notable for sodium of 124 which is lower than before but the patient has already received 1 L fluid bolus and given his EF of 25 to 30% as per his medical record I am reluctant to give additional fluids at this time.  CBC is normal.  Initial troponin was 13, second is pending, but I do not think he has had a primary coronary event.  Magnesium is still pending.  Potassium is normal.  Patient understands the need to stay in the hospital.  Consulting the hospitalist for admission. [CF]  [phone] Most recent blood pressures have been consistently better at around 104 systolic over about 86 diastolic. [CF]  [id number] Magnesium: 2.2 [CF]  0739 Discussed case by phone with Dr. Clyde Lundborg who will admit. [CF]    Clinical Course User Index [CF] Loleta Rose,  MD     ____________________________________________  FINAL CLINICAL IMPRESSION(S) / ED DIAGNOSES  Final diagnoses:  Atrial fibrillation with RVR (HCC)  Chest pain, unspecified type  Hyponatremia     MEDICATIONS GIVEN DURING THIS VISIT:  Medications  diltiazem (CARDIZEM) 125 mg in dextrose 5% 125 mL (1 mg/mL) infusion (7.5 mg/hr Intravenous Rate/Dose Change 01/06/21 0632)  morphine 2 MG/ML injection 2 mg (has no administration in time range)  ondansetron (ZOFRAN) injection 4 mg (has no administration in time range)  acetaminophen (TYLENOL) tablet 650 mg (has no administration in time range)  nicotine (NICODERM CQ - dosed in mg/24 hours) patch 21 mg (has no administration in time range)  insulin aspart (novoLOG) injection 0-9 Units (has no administration in time range)  insulin aspart (novoLOG) injection 0-5 Units (has no administration in time range)  midazolam (VERSED) injection 4 mg (4 mg Intravenous Given by Other 01/06/21 0417)  fentaNYL (SUBLIMAZE) injection 50 mcg (50 mcg Intravenous Not Given 01/06/21 0530)  sodium chloride 0.9 % bolus 1,000 mL (0 mLs Intravenous Stopped 01/06/21 0528)  diltiazem (CARDIZEM) injection 15 mg ( Intravenous Not Given 01/06/21 0540)     ED Discharge Orders    None      *Please note:  George Shaw was evaluated in Emergency Department on 01/06/2021 for the symptoms described in the history of present illness. He was evaluated in the context of the global COVID-19 pandemic, which necessitated consideration that the patient might be at risk for infection with the SARS-CoV-2 virus that causes COVID-19. Institutional protocols and algorithms that pertain to the evaluation of patients at risk for COVID-19 are in a state of rapid change based on information released by regulatory bodies including the CDC and federal and state organizations. These policies and algorithms were followed during the patient's care in the ED.  Some ED evaluations and  interventions may be delayed as a result of limited staffing during and after the pandemic.*  Note:  This document was prepared using Dragon voice recognition software and may include unintentional dictation errors.   Loleta Rose, MD 01/06/21 [phone]

## 2021-01-06 NOTE — ED Notes (Signed)
Dr. Callwood at bedside at this time.  

## 2021-01-06 NOTE — ED Notes (Signed)
Pt agreeable to cardioversion

## 2021-01-06 NOTE — ED Notes (Signed)
Pt remains alert, Dr. York Cerise at bedside discussing options with pt.

## 2021-01-06 NOTE — Progress Notes (Signed)
   01/06/21 2022  Assess: MEWS Score  Temp 99.1 F (37.3 C)  BP (!) 131/92  Pulse Rate (!) 122  ECG Heart Rate (!) 122  Resp 19  Level of Consciousness Alert  SpO2 96 %  O2 Device Room Air  Patient Activity (if Appropriate) In bed  Assess: if the MEWS score is Yellow or Red  Were vital signs taken at a resting state? Yes  Focused Assessment No change from prior assessment  Early Detection of Sepsis Score *See Row Information* Medium  MEWS guidelines implemented *See Row Information* No, previously yellow, continue vital signs every 4 hours  Treat  Pain Scale 0-10  Pain Score 0  Escalate  MEWS: Escalate Yellow: discuss with charge nurse/RN and consider discussing with provider and RRT  Notify: Charge Nurse/RN  Name of Charge Nurse/RN Notified Mericar RN  Date Charge Nurse/RN Notified 01/06/21  Time Charge Nurse/RN Notified 2100  Notify: Provider  Provider Name/Title B. Jon Billings NP  Date Provider Notified 01/06/21  Time Provider Notified 2045  Notification Type Face-to-face  Notification Reason Other (Comment) (Yellow MEWS d/t Afib 120's. Pt cont on Amio gtt (see MAR), BP stable and pt with CP and asymtomatic. Please advise if want to implement any interventions- Thanks)  Provider response No new orders Jon Billings NP noted plan of care and continue with Amio gtt as ordered and continue to monitor vitals and patient. Please notify if any new changes. May continue with vitals Q4 since stable and yellow MEWS previously)  Date of Provider Response 01/06/21  Time of Provider Response 2055  Document  Patient Outcome Other (Comment) (Stable on Amio gtt and pt with no symtoms of Afib heartrate in 110-120's. Continue to monitor patient and vitals)  Progress note created (see row info) Yes    MEWS YELLOW score 2 for HEARTRATE

## 2021-01-06 NOTE — ED Notes (Signed)
Repeat EKG preformed at this time and given to Dr. York Cerise

## 2021-01-06 NOTE — Consult Note (Signed)
CARDIOLOGY CONSULT NOTE               Patient ID: George Shaw MRN: [id number] DOB/AGE: 48-05-1971 48 y.o.  Admit date: 01/06/2021 Referring Physician Dr Claudius Sis hospitalist Primary Physician Lorenso Quarry, NP Primary Cardiologist Dr. Darrold Junker Reason for Consultation atrial fibrillation chest pain  HPI: Patient is a 50 year old male history of recently diagnosed rapid atrial fibrillation placed on Eliquis he has congestive heart failure cardiomyopathy EF around 25 to 30% hypertension of the brain with diabetes GERD renal insufficiency tobacco abuse presented with chest pain and palpitations shortness of breath heart rate was 215 thought to be possible SVT patient was hypotensive he was subsequently cardioverted maintained on Eliquis placed on dual drip without significant improvement so cardiology consultation was done recommended for further assessment.  Chest pain is somewhat improved.  BNP elevated to 1600.  Feels somewhat better heart rate currently around 130 A. fib no further chest pain  Review of systems complete and found to be negative unless listed above     Past Medical History:  Diagnosis Date  . Arrhythmia    atrial fibrillation  . CHF (congestive heart failure) (HCC)   . Diabetes mellitus without complication (HCC)   . Hypertension     History reviewed. No pertinent surgical history.  (Not in a hospital admission)  Social History   Socioeconomic History  . Marital status: Legally Separated    Spouse name: Not on file  . Number of children: Not on file  . Years of education: Not on file  . Highest education level: Not on file  Occupational History  . Not on file  Tobacco Use  . Smoking status: Light Tobacco Smoker    Types: Cigars  . Smokeless tobacco: Never Used  Substance and Sexual Activity  . Alcohol use: Not Currently  . Drug use: Not Currently  . Sexual activity: Yes  Other Topics Concern  . Not on file  Social History Narrative  .  Not on file   Social Determinants of Health   Financial Resource Strain: Not on file  Food Insecurity: Not on file  Transportation Needs: Not on file  Physical Activity: Not on file  Stress: Not on file  Social Connections: Not on file  Intimate Partner Violence: Not on file    Family History  Problem Relation Age of Onset  . Diabetes Mellitus II Mother   . Diabetes Mellitus II Father       Review of systems complete and found to be negative unless listed above      PHYSICAL EXAM  General: Well developed, well nourished, in no acute distress HEENT:  Normocephalic and atramatic Neck:  No JVD.  Lungs: Clear bilaterally to auscultation and percussion. Heart: Irregular irregular. Normal S1 and S2 without gallops or murmurs.  Abdomen: Bowel sounds are positive, abdomen soft and non-tender  Msk:  Back normal, normal gait. Normal strength and tone for age. Extremities: No clubbing, cyanosis or edema.   Neuro: Alert and oriented X 3. Psych:  Good affect, responds appropriately  Labs:   Lab Results  Component Value Date   WBC 7.7 01/06/2021   HGB 16.9 01/06/2021   HCT 48.7 01/06/2021   MCV 85.3 01/06/2021   PLT 156 01/06/2021    Recent Labs  Lab 01/06/21 0755  NA 130*  K 4.1  CL 94*  CO2 27  BUN 25*  CREATININE 1.40*  CALCIUM 8.3*  GLUCOSE 248*   No results found for: CKTOTAL, CKMB, CKMBINDEX,  TROPONINI No results found for: CHOL No results found for: HDL No results found for: LDLCALC No results found for: TRIG No results found for: CHOLHDL No results found for: LDLDIRECT    Radiology: DG Chest Port 1 View  Result Date: 01/06/2021 CLINICAL DATA:  Chest pain, AFib EXAM: PORTABLE CHEST 1 VIEW COMPARISON:  12/23/2020 FINDINGS: Mild right basilar opacity, new, atelectasis versus pneumonia. No pleural effusion or pneumothorax. The heart is top-normal in size. IMPRESSION: Mild right basilar opacity, new, atelectasis versus pneumonia. Electronically Signed   By:  Charline Bills M.D.   On: 01/06/2021 09:02   DG Chest Portable 1 View  Result Date: 12/23/2020 CLINICAL DATA:  Chest pain and dyspnea EXAM: PORTABLE CHEST 1 VIEW COMPARISON:  None. FINDINGS: Pacer pad overlies the upper left chest. Top-normal heart size. Normal mediastinal contour. No pneumothorax. No pleural effusion. Lungs appear clear, with no acute consolidative airspace disease and no pulmonary edema. IMPRESSION: No active disease. Electronically Signed   By: Delbert Phenix M.D.   On: 12/23/2020 18:52   ECHOCARDIOGRAM COMPLETE  Result Date: 12/24/2020    ECHOCARDIOGRAM REPORT   Patient Name:   RITCHIE PROIETTI Date of Exam: 12/24/2020 Medical Rec #:  [id number]        Height:       74.0 in Accession #:    [id number]       Weight:       235.4 lb Date of Birth:  Nov 19, 1970        BSA:          2.329 m Patient Age:    49 years         BP:           102/76 mmHg Patient Gender: M                HR:           104 bpm. Exam Location:  [facility] Procedure: 2D Echo, Color Doppler, Cardiac Doppler and Strain Analysis Indications:     I48.91 Atrial fibrillation; I50.31 CHF-Acute Diastolic  History:         Patient has no prior history of Echocardiogram examinations.                  Risk Factors:Hypertension, Diabetes and HCL.  Sonographer:     Humphrey Rolls RDCS (AE) Referring Phys:  [id number] Vernetta Honey MANSY Diagnosing Phys: [name] MD  Sonographer Comments: Global longitudinal strain was attempted. IMPRESSIONS  1. Left ventricular ejection fraction, by estimation, is 25 to 30%. The left ventricle has severely decreased function. The left ventricle demonstrates global hypokinesis. The left ventricular internal cavity size was mildly dilated. There is mild left ventricular hypertrophy. Left ventricular diastolic parameters are indeterminate. The average left ventricular global longitudinal strain is -7.5 %. The global longitudinal strain is abnormal.  2. Right ventricular systolic function is normal. The right  ventricular size is normal. There is normal pulmonary artery systolic pressure.  3. Left atrial size was moderately dilated.  4. Right atrial size was moderately dilated.  5. The mitral valve is normal in structure. Mild mitral valve regurgitation. No evidence of mitral stenosis.  6. The aortic valve is normal in structure. Aortic valve regurgitation is not visualized. No aortic stenosis is present. FINDINGS  Left Ventricle: Left ventricular ejection fraction, by estimation, is 25 to 30%. The left ventricle has severely decreased function. The left ventricle demonstrates global hypokinesis. The average left ventricular global longitudinal strain is -7.5 %. The  global longitudinal strain is abnormal. The left ventricular internal cavity size was mildly dilated. There is mild left ventricular hypertrophy. Left ventricular diastolic parameters are indeterminate. Right Ventricle: The right ventricular size is normal. No increase in right ventricular wall thickness. Right ventricular systolic function is normal. There is normal pulmonary artery systolic pressure. The tricuspid regurgitant velocity is 2.38 m/s, and  with an assumed right atrial pressure of 5 mmHg, the estimated right ventricular systolic pressure is 27.7 mmHg. Left Atrium: Left atrial size was moderately dilated. Right Atrium: Right atrial size was moderately dilated. Pericardium: There is no evidence of pericardial effusion. Mitral Valve: The mitral valve is normal in structure. Mild mitral valve regurgitation. No evidence of mitral valve stenosis. MV peak gradient, 5.3 mmHg. The mean mitral valve gradient is 2.0 mmHg. Tricuspid Valve: The tricuspid valve is normal in structure. Tricuspid valve regurgitation is mild . No evidence of tricuspid stenosis. Aortic Valve: The aortic valve is normal in structure. Aortic valve regurgitation is not visualized. No aortic stenosis is present. Aortic valve mean gradient measures 3.0 mmHg. Aortic valve peak gradient  measures 5.5 mmHg. Aortic valve area, by VTI measures 2.88 cm. Pulmonic Valve: The pulmonic valve was normal in structure. Pulmonic valve regurgitation is not visualized. No evidence of pulmonic stenosis. Aorta: The aortic root is normal in size and structure. Venous: The inferior vena cava was not well visualized. IAS/Shunts: No atrial level shunt detected by color flow Doppler.  LEFT VENTRICLE PLAX 2D LVIDd:         5.00 cm      Diastology LVIDs:         4.60 cm      LV e' medial:    7.51 cm/s LV PW:         1.30 cm      LV E/e' medial:  14.3 LV IVS:        1.10 cm      LV e' lateral:   12.20 cm/s LVOT diam:     2.20 cm      LV E/e' lateral: 8.8 LV SV:         54 LV SV Index:   23           2D Longitudinal Strain LVOT Area:     3.80 cm     2D Strain GLS Avg:     -7.5 %  LV Volumes (MOD) LV vol d, MOD A2C: 156.0 ml LV vol d, MOD A4C: 131.0 ml LV vol s, MOD A2C: 105.0 ml LV vol s, MOD A4C: 64.8 ml LV SV MOD A2C:     51.0 ml LV SV MOD A4C:     131.0 ml LV SV MOD BP:      59.7 ml RIGHT VENTRICLE RV Basal diam:  4.00 cm RV S prime:     11.10 cm/s LEFT ATRIUM              Index       RIGHT ATRIUM           Index LA diam:        5.30 cm  2.28 cm/m  RA Area:     24.40 cm LA Vol (A2C):   95.1 ml  40.83 ml/m RA Volume:   79.20 ml  34.00 ml/m LA Vol (A4C):   111.0 ml 47.65 ml/m LA Biplane Vol: 104.0 ml 44.65 ml/m  AORTIC VALVE  PULMONIC VALVE AV Area (Vmax):    2.89 cm    PV Vmax:       0.81 m/s AV Area (Vmean):   2.69 cm    PV Vmean:      57.500 cm/s AV Area (VTI):     2.88 cm    PV VTI:        0.141 m AV Vmax:           117.00 cm/s PV Peak grad:  2.6 mmHg AV Vmean:          85.500 cm/s PV Mean grad:  1.0 mmHg AV VTI:            0.186 m AV Peak Grad:      5.5 mmHg AV Mean Grad:      3.0 mmHg LVOT Vmax:         88.90 cm/s LVOT Vmean:        60.600 cm/s LVOT VTI:          0.141 m LVOT/AV VTI ratio: 0.76  AORTA Ao Root diam: 3.80 cm MITRAL VALVE                TRICUSPID VALVE MV Area (PHT): 6.74 cm      TR Peak grad:   22.7 mmHg MV Area VTI:   3.50 cm     TR Vmax:        238.00 cm/s MV Peak grad:  5.3 mmHg MV Mean grad:  2.0 mmHg     SHUNTS MV Vmax:       1.15 m/s     Systemic VTI:  0.14 m MV Vmean:      64.4 cm/s    Systemic Diam: 2.20 cm MV Decel Time: 113 msec MV E velocity: 107.50 cm/s [name] MD Electronically signed by [name] MD Signature Date/Time: 12/24/2020/6:33:03 PM    Final     EKG: Rapid atrial fibrillation rate of around 140  ASSESSMENT AND PLAN:  Rapid atrial fibrillation rapid ventricular response  Hypotension Cardiomyopathy Diabetes Alcohol abuse Palpitation Chest pain GERD Plan agree insufficiency stage III Tobacco abuse Hyponatremia Borderline troponins . Plan Agree with admit to telemetry rule out microinfarction follow-up EKGs and troponins Recommend discontinue diltiazem because of poor rate control and hypotension Recommend starting IV amiodarone loading drip IV Give a spot dose of digoxin 0.5 to help with heart rate management Hopefully eventually will add Coreg to help with heart failure heart rate control if the blood pressure tolerates Low-dose diuretic therapy for cardiomyopathy heart failure Have the patient follow-up with nephrology for renal insufficiency Continue diabetes management and control Hold Eliquis for now and switch to Lovenox anticoagulation for A. Fib Continue Crestor therapy for lipid management Agree with maintaining Protonix therapy for reflux symptoms Maintain nicotine patch for tobacco abuse  Signed: Alwyn Pea MD 01/06/2021, 9:43 AM

## 2021-01-06 NOTE — ED Notes (Signed)
Dr. York Cerise aware of pt's BP and HR, at bedside. No new orders at this time as pt is A&Ox4, denying dizziness or chest pain. Dr. York Cerise reports plan is to continue to monitor to see if any improvement with dilt

## 2021-01-07 ENCOUNTER — Other Ambulatory Visit: Payer: Self-pay

## 2021-01-07 ENCOUNTER — Encounter
Admission: EM | Disposition: A | Discharge status: Home / Self Care | Payer: Self-pay | Source: Home / Self Care | Attending: Internal Medicine

## 2021-01-07 DIAGNOSIS — N1831 Chronic kidney disease, stage 3a: Secondary | ICD-10-CM | POA: Diagnosis not present

## 2021-01-07 DIAGNOSIS — I4891 Unspecified atrial fibrillation: Secondary | ICD-10-CM | POA: Diagnosis not present

## 2021-01-07 DIAGNOSIS — I5023 Acute on chronic systolic (congestive) heart failure: Secondary | ICD-10-CM | POA: Diagnosis not present

## 2021-01-07 DIAGNOSIS — R778 Other specified abnormalities of plasma proteins: Secondary | ICD-10-CM | POA: Diagnosis not present

## 2021-01-07 HISTORY — PX: LEFT HEART CATH AND CORONARY ANGIOGRAPHY: CATH118249

## 2021-01-07 LAB — BASIC METABOLIC PANEL
Anion gap: 8 (ref 5–15)
BUN: 31 mg/dL — ABNORMAL HIGH (ref 6–20)
CO2: 25 mmol/L (ref 22–32)
Calcium: 8.5 mg/dL — ABNORMAL LOW (ref 8.9–10.3)
Chloride: 101 mmol/L (ref 98–111)
Creatinine, Ser: 1.35 mg/dL — ABNORMAL HIGH (ref 0.61–1.24)
GFR, Estimated: >60 mL/min (ref >60)
Glucose, Bld: 238 mg/dL — ABNORMAL HIGH (ref 70–99)
Potassium: 4.6 mmol/L (ref 3.5–5.1)
Sodium: 134 mmol/L — ABNORMAL LOW (ref 135–145)

## 2021-01-07 LAB — CBC
HCT: 44.9 % (ref 39.0–52.0)
Hemoglobin: 15.4 g/dL (ref 13.0–17.0)
MCH: 29.7 pg (ref 26.0–34.0)
MCHC: 34.3 g/dL (ref 30.0–36.0)
MCV: 86.5 fL (ref 80.0–100.0)
Platelets: 146 10*3/uL — ABNORMAL LOW (ref 150–400)
RBC: 5.19 MIL/uL (ref 4.22–5.81)
RDW: 13.2 % (ref 11.5–15.5)
WBC: 4.9 10*3/uL (ref 4.0–10.5)
nRBC: 0 % (ref 0.0–0.2)

## 2021-01-07 LAB — HEMOGLOBIN A1C
Hgb A1c MFr Bld: 8 % — ABNORMAL HIGH (ref 4.8–5.6)
Mean Plasma Glucose: 182.9 mg/dL

## 2021-01-07 LAB — MAGNESIUM: Magnesium: 2.1 mg/dL (ref 1.7–2.4)

## 2021-01-07 LAB — GLUCOSE, CAPILLARY
Glucose-Capillary: 210 mg/dL — ABNORMAL HIGH (ref 70–99)
Glucose-Capillary: 188 mg/dL — ABNORMAL HIGH (ref 70–99)
Glucose-Capillary: 174 mg/dL — ABNORMAL HIGH (ref 70–99)
Glucose-Capillary: 221 mg/dL — ABNORMAL HIGH (ref 70–99)
Glucose-Capillary: 315 mg/dL — ABNORMAL HIGH (ref 70–99)

## 2021-01-07 LAB — PROCALCITONIN: Procalcitonin: 0.22 ng/mL

## 2021-01-07 SURGERY — LEFT HEART CATH AND CORONARY ANGIOGRAPHY
Anesthesia: Moderate Sedation

## 2021-01-07 MED ORDER — VERAPAMIL HCL 2.5 MG/ML IV SOLN
INTRAVENOUS | Status: AC
  Filled 2021-01-07: qty 2

## 2021-01-07 MED ORDER — HEPARIN SODIUM (PORCINE) 1000 UNIT/ML IJ SOLN
INTRAMUSCULAR | Status: DC | PRN
  Administered 2021-01-07: 5000 [IU] via INTRAVENOUS

## 2021-01-07 MED ORDER — FUROSEMIDE 10 MG/ML IJ SOLN
40.0000 mg | Freq: Two times a day (BID) | INTRAMUSCULAR | Status: DC
  Administered 2021-01-08: 40 mg via INTRAVENOUS
  Filled 2021-01-07: qty 4

## 2021-01-07 MED ORDER — SODIUM CHLORIDE 0.9% FLUSH: 3.0000 mL | INTRAVENOUS | Status: DC | PRN

## 2021-01-07 MED ORDER — ACETAMINOPHEN 325 MG PO TABS: 650.0000 mg | ORAL_TABLET | ORAL | Status: DC | PRN

## 2021-01-07 MED ORDER — HEPARIN SODIUM (PORCINE) 1000 UNIT/ML IJ SOLN
INTRAMUSCULAR | Status: AC
  Filled 2021-01-07: qty 1

## 2021-01-07 MED ORDER — FUROSEMIDE 10 MG/ML IJ SOLN
INTRAMUSCULAR | Status: DC | PRN
  Administered 2021-01-07: 40 mg via INTRAVENOUS

## 2021-01-07 MED ORDER — HYDRALAZINE HCL 20 MG/ML IJ SOLN: 10.0000 mg | INTRAMUSCULAR | Status: AC | PRN

## 2021-01-07 MED ORDER — SODIUM CHLORIDE 0.9% FLUSH
3.0000 mL | Freq: Two times a day (BID) | INTRAVENOUS | Status: DC
  Administered 2021-01-07 – 2021-01-09 (×6): 3 mL via INTRAVENOUS

## 2021-01-07 MED ORDER — SODIUM CHLORIDE 0.9 % WEIGHT BASED INFUSION: 1.0000 mL/kg/h | INTRAVENOUS | Status: DC

## 2021-01-07 MED ORDER — SODIUM CHLORIDE 0.9 % IV SOLN: 250.0000 mL | INTRAVENOUS | Status: DC | PRN

## 2021-01-07 MED ORDER — ASPIRIN 81 MG PO CHEW
81.0000 mg | CHEWABLE_TABLET | Freq: Once | ORAL | Status: AC
  Administered 2021-01-07: 81 mg via ORAL
  Filled 2021-01-07: qty 1

## 2021-01-07 MED ORDER — FENTANYL CITRATE (PF) 100 MCG/2ML IJ SOLN
INTRAMUSCULAR | Status: AC
  Filled 2021-01-07: qty 2

## 2021-01-07 MED ORDER — FENTANYL CITRATE (PF) 100 MCG/2ML IJ SOLN
INTRAMUSCULAR | Status: DC | PRN
  Administered 2021-01-07: 50 ug via INTRAVENOUS

## 2021-01-07 MED ORDER — IOHEXOL 300 MG/ML  SOLN
INTRAMUSCULAR | Status: DC | PRN
  Administered 2021-01-07: 55 mL

## 2021-01-07 MED ORDER — METOPROLOL TARTRATE 5 MG/5ML IV SOLN
5.0000 mg | Freq: Once | INTRAVENOUS | Status: AC
  Administered 2021-01-07: 5 mg via INTRAVENOUS
  Filled 2021-01-07: qty 5

## 2021-01-07 MED ORDER — SODIUM CHLORIDE 0.9 % WEIGHT BASED INFUSION
3.0000 mL/kg/h | INTRAVENOUS | Status: DC
  Administered 2021-01-07: 3 mL/kg/h via INTRAVENOUS

## 2021-01-07 MED ORDER — METOPROLOL TARTRATE 5 MG/5ML IV SOLN
5.0000 mg | Freq: Four times a day (QID) | INTRAVENOUS | Status: DC | PRN
  Administered 2021-01-07: 5 mg via INTRAVENOUS

## 2021-01-07 MED ORDER — ASPIRIN 81 MG PO CHEW: 81.0000 mg | CHEWABLE_TABLET | ORAL | Status: DC

## 2021-01-07 MED ORDER — METOPROLOL TARTRATE 5 MG/5ML IV SOLN
INTRAVENOUS | Status: AC
  Filled 2021-01-07: qty 5

## 2021-01-07 MED ORDER — HEPARIN (PORCINE) IN NACL 2000-0.9 UNIT/L-% IV SOLN
INTRAVENOUS | Status: DC | PRN
  Administered 2021-01-07: 1000 mL

## 2021-01-07 MED ORDER — MIDAZOLAM HCL 2 MG/2ML IJ SOLN
INTRAMUSCULAR | Status: AC
  Filled 2021-01-07: qty 2

## 2021-01-07 MED ORDER — SODIUM CHLORIDE 0.9% FLUSH: 3.0000 mL | Freq: Two times a day (BID) | INTRAVENOUS | Status: DC

## 2021-01-07 MED ORDER — LABETALOL HCL 5 MG/ML IV SOLN: 10.0000 mg | INTRAVENOUS | Status: DC | PRN

## 2021-01-07 MED ORDER — LABETALOL HCL 5 MG/ML IV SOLN: 10.0000 mg | INTRAVENOUS | Status: AC | PRN

## 2021-01-07 MED ORDER — HEPARIN (PORCINE) IN NACL 1000-0.9 UT/500ML-% IV SOLN
INTRAVENOUS | Status: AC
  Filled 2021-01-07: qty 1000

## 2021-01-07 MED ORDER — FUROSEMIDE 10 MG/ML IJ SOLN
INTRAMUSCULAR | Status: AC
  Filled 2021-01-07: qty 4

## 2021-01-07 MED ORDER — VERAPAMIL HCL 2.5 MG/ML IV SOLN
INTRAVENOUS | Status: DC | PRN
  Administered 2021-01-07: 2.5 mg via INTRAVENOUS

## 2021-01-07 MED ORDER — SODIUM CHLORIDE 0.9 % WEIGHT BASED INFUSION: 3.0000 mL/kg/h | INTRAVENOUS | Status: DC

## 2021-01-07 MED ORDER — ONDANSETRON HCL 4 MG/2ML IJ SOLN: 4.0000 mg | Freq: Four times a day (QID) | INTRAMUSCULAR | Status: DC | PRN

## 2021-01-07 MED ORDER — MIDAZOLAM HCL 2 MG/2ML IJ SOLN
INTRAMUSCULAR | Status: DC | PRN
  Administered 2021-01-07: 1 mg via INTRAVENOUS

## 2021-01-07 MED ORDER — SODIUM CHLORIDE 0.9% FLUSH
3.0000 mL | Freq: Two times a day (BID) | INTRAVENOUS | Status: DC
  Administered 2021-01-07: 3 mL via INTRAVENOUS

## 2021-01-07 SURGICAL SUPPLY — 11 items

## 2021-01-07 NOTE — Progress Notes (Signed)
PROGRESS NOTE    George Shaw  BSW:[id number] DOB: 05/20/1971 DOA: 01/06/2021 PCP: Lorenso Quarry, NP   Chief complaint.  Palpitation chest pain. Brief Narrative:  George Shaw is a 50 y.o. male with medical history significant of recent admission due to new A fib with RVR on Eliquis, and sCHF with EF 25-30%, HTN, HLD, DM, GERD, CKD-IIIa, tobacco abuse, who present with palpitation, chest pain and SOB. Upon arrival in the emergency room, her heart rate was within 120s to 140s, blood pressure 74/64 which improved to 100/70. He is started on amiodarone drip.  Heart cath is performed   Assessment & Plan:   Principal Problem:   Atrial fibrillation with RVR (HCC) Active Problems:   Chronic renal failure, stage 3a (HCC)   Type II diabetes mellitus with renal manifestations (HCC)   HTN (hypertension)   HLD (hyperlipidemia)   GERD (gastroesophageal reflux disease)   Chronic systolic CHF (congestive heart failure) (HCC)   Tobacco abuse   Chest pain   Hyponatremia   Elevated troponin   Hypotension   CAP (community acquired pneumonia)  #1.  Paroxysmal atrial fibrillation with rapid ventricle response. Acute on chronic systolic congestive heart failure. Chest pain with mild elevation troponin. Transient hypotension Appreciate cardiology consult, currently he is on amiodarone drip.  He also received 0.5 mg digoxin.  We will also start metoprolol 5 mg IV every 6 hours as needed for heart rate over 110. Heart cath is ongoing. Continue anticoagulation after the procedure. Patient also has significant elevation in BNP at 1638, he has acute on chronic systolic congestive heart failure.  We will start IV Lasix. Patient chest x-ray has right lower lobe infiltrates, but the patient does not have any significant cough.  Will obtain procalcitonin level.  Hold off antibiotics for now.  2.  Hyponatremia. Chronic kidney disease stage IIIa. Due to patient congestive heart failure,  hyponatremia could be due to volume overload.  Continue Lasix, recheck BMP tomorrow.  3.  Uncontrolled type 2 diabetes with hyperglycemia. Continue scheduled insulin, sliding scale insulin.  4.  Morbid obesity.      DVT prophylaxis: Lovenox Code Status: Full Family Communication: Parents at bedside. Disposition Plan:  .   Status is: Inpatient  Remains inpatient appropriate because:Inpatient level of care appropriate due to severity of illness   Dispo: The patient is from: Home              Anticipated d/c is to: Home              Patient currently is not medically stable to d/c.   Difficult to place patient No        I/O last 3 completed shifts: In: 2916.4 [P.O.:1565; I.V.:351.4; IV Piggyback:1000] Out: 2950 [Urine:2950] Total I/O In: -  Out: 2750 [Urine:2750]     Consultants:   Cardiology  Procedures: Heart cath  Antimicrobials:None Subjective: Patient still has significant palpitation with tachycardia.  Chest pain has resolved.  Short of breath with exertion.  No cough. No abdominal pain or nausea vomiting. No dysuria hematuria No fever chills.   Objective: Vitals:   01/07/21 0747 01/07/21 0900 01/07/21 1130 01/07/21 1305  BP: (!) 149/110  (!) 145/107 (!) 138/109  Pulse: (!) 119  (!) 123 (!) 113  Resp:   18 20  Temp: (!) 97.4 F (36.3 C)  97.8 F (36.6 C)   TempSrc: Oral  Oral   SpO2: 99%  97% 97%  Weight:  100 kg  100 kg  Height:  6\' 4"  (1.93 m)    Intake/Output Summary (Last 24 hours) at 01/07/2021 1402 Last data filed at 01/07/2021 1130 Gross per 24 hour  Intake 1263.15 ml  Output 4650 ml  Net -3386.85 ml   Filed Weights   01/07/21 0435 01/07/21 0900 01/07/21 1305  Weight: 100.3 kg 100 kg 100 kg    Examination:  General exam: Appears calm and comfortable, morbid obese. Respiratory system: Clear to auscultation. Respiratory effort normal. Cardiovascular system: Irregularly irregular, tachycardia. No JVD, murmurs, rubs, gallops  or clicks.  Gastrointestinal system: Abdomen is nondistended, soft and nontender. No organomegaly or masses felt. Normal bowel sounds heard. Central nervous system: Alert and oriented. No focal neurological deficits. Extremities: Trace leg edema Skin: No rashes, lesions or ulcers Psychiatry:  Mood & affect appropriate.     Data Reviewed: I have personally reviewed following labs and imaging studies  CBC: Recent Labs  Lab 01/06/21 0416 01/07/21 0330  WBC 7.7 4.9  NEUTROABS 4.8  --   HGB 16.9 15.4  HCT 48.7 44.9  MCV 85.3 86.5  PLT 156 146*   Basic Metabolic Panel: Recent Labs  Lab 01/06/21 0416 01/06/21 0755 01/06/21 1518 01/06/21 2332 01/07/21 0330  NA 124* 130* 134* 134*  --   K 4.3 4.1 4.5 4.6  --   CL 91* 94* 101 101  --   CO2 20* 27 25 25   --   GLUCOSE 259* 248* 142* 238*  --   BUN 25* 25* 24* 31*  --   CREATININE 1.52* 1.40* 1.23 1.35*  --   CALCIUM 9.0 8.3* 8.5* 8.5*  --   MG 2.2  --   --   --  2.1   GFR: Estimated Creatinine Clearance: 83 mL/min (A) (by C-G formula based on SCr of 1.35 mg/dL (H)). Liver Function Tests: No results for input(s): AST, ALT, ALKPHOS, BILITOT, PROT, ALBUMIN in the last 168 hours. No results for input(s): LIPASE, AMYLASE in the last 168 hours. No results for input(s): AMMONIA in the last 168 hours. Coagulation Profile: Recent Labs  Lab 01/06/21 0457  INR 1.4*   Cardiac Enzymes: No results for input(s): CKTOTAL, CKMB, CKMBINDEX, TROPONINI in the last 168 hours. BNP (last 3 results) No results for input(s): PROBNP in the last 8760 hours. HbA1C: Recent Labs    01/07/21 0330  HGBA1C 8.0*   CBG: Recent Labs  Lab 01/06/21 1128 01/06/21 1635 01/06/21 2023 01/07/21 0748 01/07/21 1129  GLUCAP 205* 206* 150* 210* 188*   Lipid Profile: No results for input(s): CHOL, HDL, LDLCALC, TRIG, CHOLHDL, LDLDIRECT in the last 72 hours. Thyroid Function Tests: Recent Labs    01/06/21 0755  TSH 3.414   Anemia Panel: No  results for input(s): VITAMINB12, FOLATE, FERRITIN, TIBC, IRON, RETICCTPCT in the last 72 hours. Sepsis Labs: No results for input(s): PROCALCITON, LATICACIDVEN in the last 168 hours.  Recent Results (from the past 240 hour(s))  Resp Panel by RT-PCR (Flu A&B, Covid) Nasopharyngeal Swab     Status: None   Collection Time: 01/06/21  6:46 AM   Specimen: Nasopharyngeal Swab; Nasopharyngeal(NP) swabs in vial transport medium  Result Value Ref Range Status   SARS Coronavirus 2 by RT PCR NEGATIVE NEGATIVE Final    Comment: (NOTE) SARS-CoV-2 target nucleic acids are NOT DETECTED.  The SARS-CoV-2 RNA is generally detectable in upper respiratory specimens during the acute phase of infection. The lowest concentration of SARS-CoV-2 viral copies this assay can detect is 138 copies/mL. A negative result does not preclude SARS-Cov-2  infection and should not be used as the sole basis for treatment or other patient management decisions. A negative result may occur with  improper specimen collection/handling, submission of specimen other than nasopharyngeal swab, presence of viral mutation(s) within the areas targeted by this assay, and inadequate number of viral copies(<138 copies/mL). A negative result must be combined with clinical observations, patient history, and epidemiological information. The expected result is Negative.  Fact Sheet for Patients:  BloggerCourse.com  Fact Sheet for Healthcare Providers:  SeriousBroker.it  This test is no t yet approved or cleared by the [place] FDA and  has been authorized for detection and/or diagnosis of SARS-CoV-2 by FDA under an Emergency Use Authorization (EUA). This EUA will remain  in effect (meaning this test can be used) for the duration of the COVID-19 declaration under Section 564(b)(1) of the Act, 21 U.S.C.section 360bbb-3(b)(1), unless the authorization is terminated  or revoked sooner.        Influenza A by PCR NEGATIVE NEGATIVE Final   Influenza B by PCR NEGATIVE NEGATIVE Final    Comment: (NOTE) The Xpert Xpress SARS-CoV-2/FLU/RSV plus assay is intended as an aid in the diagnosis of influenza from Nasopharyngeal swab specimens and should not be used as a sole basis for treatment. Nasal washings and aspirates are unacceptable for Xpert Xpress SARS-CoV-2/FLU/RSV testing.  Fact Sheet for Patients: BloggerCourse.com  Fact Sheet for Healthcare Providers: SeriousBroker.it  This test is not yet approved or cleared by the [place] FDA and has been authorized for detection and/or diagnosis of SARS-CoV-2 by FDA under an Emergency Use Authorization (EUA). This EUA will remain in effect (meaning this test can be used) for the duration of the COVID-19 declaration under Section 564(b)(1) of the Act, 21 U.S.C. section 360bbb-3(b)(1), unless the authorization is terminated or revoked.  Performed at [facility], [address] [place], [place] [address]          Radiology Studies: DG Chest Forest 1 View  Result Date: 01/06/2021 CLINICAL DATA:  Chest pain, AFib EXAM: PORTABLE CHEST 1 VIEW COMPARISON:  12/23/2020 FINDINGS: Mild right basilar opacity, new, atelectasis versus pneumonia. No pleural effusion or pneumothorax. The heart is top-normal in size. IMPRESSION: Mild right basilar opacity, new, atelectasis versus pneumonia. Electronically Signed   By: Charline Bills M.D.   On: 01/06/2021 09:02        Scheduled Meds: . [START ON 01/08/2021] aspirin  81 mg Oral Pre-Cath  . [MAR Hold] enoxaparin (LOVENOX) injection  1 mg/kg (Adjusted) Subcutaneous Q12H  . [MAR Hold] furosemide  20 mg Oral Daily  . [MAR Hold] insulin aspart  0-5 Units Subcutaneous QHS  . [MAR Hold] insulin aspart  0-9 Units Subcutaneous TID WC  . [MAR Hold] insulin glargine  5 Units Subcutaneous QHS  . [MAR Hold] magnesium oxide   400 mg Oral Daily  . [MAR Hold] nicotine  21 mg Transdermal Daily  . [MAR Hold] omeprazole  20 mg Oral Daily  . [MAR Hold] rosuvastatin  10 mg Oral Daily  . [MAR Hold] sodium chloride flush  3 mL Intravenous Q12H  . [MAR Hold] sodium chloride flush  3 mL Intravenous Q12H   Continuous Infusions: . sodium chloride    . sodium chloride    . [START ON 01/08/2021] sodium chloride     Followed by  . [START ON 01/08/2021] sodium chloride    . [START ON 01/08/2021] sodium chloride 3 mL/kg/hr (01/07/21 1228)   Followed by  . [START ON 01/08/2021] sodium chloride    .  amiodarone 30 mg/hr (01/07/21 0919)     LOS: 1 day    Time spent: 33 minutes    Marrion Coy, MD [facility]   To contact the attending provider between 7A-7P or the covering provider during after hours 7P-7A, please log into the web site [email or web address] and access using universal [place] password for that web site. If you do not have the password, please call the hospital operator.  01/07/2021, 2:02 PM

## 2021-01-07 NOTE — Progress Notes (Signed)
Pt. Med. With lopressor 5 mg slow IVP for High BP/HR. Pt. Asymptomatic. Voiding frequently. No cardiac or subjective c/o.

## 2021-01-07 NOTE — Progress Notes (Signed)
[facility] Cardiology    SUBJECTIVE: Patient states to feel reasonably well denies any palpitations tachycardia or further chest pain presented to the emergency room with severe chest pain and heart racing requiring cardioversion currently on heparin therapy   Vitals:   01/06/21 1908 01/06/21 2022 01/06/21 2316 01/07/21 0435  BP: (!) 115/96 (!) 131/92 (!) 143/107 (!) 146/106  Pulse: (!) 117 (!) 122 (!) 120 (!) 117  Resp: 18 19 18 18   Temp: 98 F (36.7 C) 99.1 F (37.3 C) 97.7 F (36.5 C) 97.9 F (36.6 C)  TempSrc:  Oral Oral Oral  SpO2: 100% 96% 96% 99%  Weight:    100.3 kg  Height:         Intake/Output Summary (Last 24 hours) at 01/07/2021 01/09/2021 Last data filed at 01/06/2021 2300 Gross per 24 hour  Intake 1916.35 ml  Output 2950 ml  Net -1033.65 ml      PHYSICAL EXAM  General: Well developed, well nourished, in no acute distress HEENT:  Normocephalic and atramatic Neck:  No JVD.  Lungs: Clear bilaterally to auscultation and percussion. Heart: Tachycardic irregular. Normal S1 and S2 without gallops or murmurs.  Abdomen: Bowel sounds are positive, abdomen soft and non-tender  Msk:  Back normal, normal gait. Normal strength and tone for age. Extremities: No clubbing, cyanosis or edema.   Neuro: Alert and oriented X 3. Psych:  Good affect, responds appropriately   LABS: Basic Metabolic Panel: Recent Labs    01/06/21 0416 01/06/21 0755 01/06/21 1518 01/06/21 2332 01/07/21 0330  NA 124*   < > 134* 134*  --   K 4.3   < > 4.5 4.6  --   CL 91*   < > 101 101  --   CO2 20*   < > 25 25  --   GLUCOSE 259*   < > 142* 238*  --   BUN 25*   < > 24* 31*  --   CREATININE 1.52*   < > 1.23 1.35*  --   CALCIUM 9.0   < > 8.5* 8.5*  --   MG 2.2  --   --   --  2.1   < > = values in this interval not displayed.   Liver Function Tests: No results for input(s): AST, ALT, ALKPHOS, BILITOT, PROT, ALBUMIN in the last 72 hours. No results for input(s): LIPASE, AMYLASE in the last 72  hours. CBC: Recent Labs    01/06/21 0416 01/07/21 0330  WBC 7.7 4.9  NEUTROABS 4.8  --   HGB 16.9 15.4  HCT 48.7 44.9  MCV 85.3 86.5  PLT 156 146*   Cardiac Enzymes: No results for input(s): CKTOTAL, CKMB, CKMBINDEX, TROPONINI in the last 72 hours. BNP: Invalid input(s): POCBNP D-Dimer: No results for input(s): DDIMER in the last 72 hours. Hemoglobin A1C: No results for input(s): HGBA1C in the last 72 hours. Fasting Lipid Panel: No results for input(s): CHOL, HDL, LDLCALC, TRIG, CHOLHDL, LDLDIRECT in the last 72 hours. Thyroid Function Tests: Recent Labs    01/06/21 0755  TSH 3.414   Anemia Panel: No results for input(s): VITAMINB12, FOLATE, FERRITIN, TIBC, IRON, RETICCTPCT in the last 72 hours.  DG Chest Port 1 View  Result Date: 01/06/2021 CLINICAL DATA:  Chest pain, AFib EXAM: PORTABLE CHEST 1 VIEW COMPARISON:  12/23/2020 FINDINGS: Mild right basilar opacity, new, atelectasis versus pneumonia. No pleural effusion or pneumothorax. The heart is top-normal in size. IMPRESSION: Mild right basilar opacity, new, atelectasis versus pneumonia. Electronically Signed  By: Charline Bills M.D.   On: 01/06/2021 09:02     Echo recent echo with severely depressed left ventricular function less than 30%  TELEMETRY: A. fib a flutter rate of 112 nonspecific findings  ASSESSMENT AND PLAN:  Principal Problem:   Atrial fibrillation with RVR (HCC) Active Problems:   Chronic renal failure, stage 3a (HCC)   Type II diabetes mellitus with renal manifestations (HCC)   HTN (hypertension)   HLD (hyperlipidemia)   GERD (gastroesophageal reflux disease)   Chronic systolic CHF (congestive heart failure) (HCC)   Tobacco abuse   Chest pain   Hyponatremia   Elevated troponin   Hypotension   CAP (community acquired pneumonia)    Plan Continue telemetry Continue to follow-up EKGs troponin Maintain IV heparin for anticoagulation for unstable angina as well as atrial  fibrillation Would recommend proceed with cardiac cath to rule out coronary disease as an etiology of cardiomyopathy as well as recent unstable anginal symptoms chest pain Continue diabetes management and control Hold Eliquis for now and maintain IV heparin Continue omeprazole for reflux type GI symptoms Agree with Crestor therapy for hyperlipidemia Recommend continue metoprolol for rate control as well as  diltiazem as needed Cardiomyopathy unclear etiology recommend ACE ARB or Entresto in addition to beta-blocker diuretics Proceed with left heart cath today for evaluation for possible coronary disease and ischemia   Alwyn Pea, MD 01/07/2021 6:40 AM

## 2021-01-07 NOTE — Progress Notes (Signed)
Dr. Juliann Pares in at bedside, speaking with pt. And both his parents re: cath results. All involved in conversation verbalize understanding of conversation.

## 2021-01-07 NOTE — Progress Notes (Signed)
ECG shows ST 120s, Amio gtt remains at maintenance rate. MD updated.

## 2021-01-08 ENCOUNTER — Encounter: Payer: Self-pay | Admitting: Internal Medicine

## 2021-01-08 DIAGNOSIS — I5023 Acute on chronic systolic (congestive) heart failure: Secondary | ICD-10-CM | POA: Diagnosis not present

## 2021-01-08 DIAGNOSIS — I959 Hypotension, unspecified: Secondary | ICD-10-CM | POA: Diagnosis not present

## 2021-01-08 DIAGNOSIS — I4891 Unspecified atrial fibrillation: Secondary | ICD-10-CM | POA: Diagnosis not present

## 2021-01-08 LAB — CBC WITH DIFFERENTIAL/PLATELET
Abs Immature Granulocytes: 0.03 10*3/uL (ref 0.00–0.07)
Basophils Absolute: 0.1 10*3/uL (ref 0.0–0.1)
Basophils Relative: 1 %
Eosinophils Absolute: 0.1 10*3/uL (ref 0.0–0.5)
Eosinophils Relative: 2 %
HCT: 52 % (ref 39.0–52.0)
Hemoglobin: 17.9 g/dL — ABNORMAL HIGH (ref 13.0–17.0)
Immature Granulocytes: 1 %
Lymphocytes Relative: 19 %
Lymphs Abs: 1.3 10*3/uL (ref 0.7–4.0)
MCH: 29.3 pg (ref 26.0–34.0)
MCHC: 34.4 g/dL (ref 30.0–36.0)
MCV: 85.2 fL (ref 80.0–100.0)
Monocytes Absolute: 0.7 10*3/uL (ref 0.1–1.0)
Monocytes Relative: 10 %
Neutro Abs: 4.5 10*3/uL (ref 1.7–7.7)
Neutrophils Relative %: 67 %
Platelets: 197 10*3/uL (ref 150–400)
RBC: 6.1 MIL/uL — ABNORMAL HIGH (ref 4.22–5.81)
RDW: 13.4 % (ref 11.5–15.5)
WBC: 6.7 10*3/uL (ref 4.0–10.5)
nRBC: 0 % (ref 0.0–0.2)

## 2021-01-08 LAB — GLUCOSE, CAPILLARY
Glucose-Capillary: 319 mg/dL — ABNORMAL HIGH (ref 70–99)
Glucose-Capillary: 224 mg/dL — ABNORMAL HIGH (ref 70–99)
Glucose-Capillary: 236 mg/dL — ABNORMAL HIGH (ref 70–99)
Glucose-Capillary: 242 mg/dL — ABNORMAL HIGH (ref 70–99)

## 2021-01-08 LAB — MAGNESIUM: Magnesium: 1.9 mg/dL (ref 1.7–2.4)

## 2021-01-08 LAB — BASIC METABOLIC PANEL
Anion gap: 10 (ref 5–15)
BUN: 27 mg/dL — ABNORMAL HIGH (ref 6–20)
CO2: 25 mmol/L (ref 22–32)
Calcium: 9.3 mg/dL (ref 8.9–10.3)
Chloride: 96 mmol/L — ABNORMAL LOW (ref 98–111)
Creatinine, Ser: 1.34 mg/dL — ABNORMAL HIGH (ref 0.61–1.24)
GFR, Estimated: >60 mL/min (ref >60)
Glucose, Bld: 223 mg/dL — ABNORMAL HIGH (ref 70–99)
Potassium: 4.2 mmol/L (ref 3.5–5.1)
Sodium: 131 mmol/L — ABNORMAL LOW (ref 135–145)

## 2021-01-08 MED ORDER — INSULIN GLARGINE 100 UNIT/ML ~~LOC~~ SOLN
16.0000 [IU] | Freq: Every day | SUBCUTANEOUS | Status: DC
  Administered 2021-01-08 – 2021-01-09 (×2): 16 [IU] via SUBCUTANEOUS
  Filled 2021-01-08 (×3): qty 0.16

## 2021-01-08 MED ORDER — ENOXAPARIN SODIUM 100 MG/ML ~~LOC~~ SOLN
1.0000 mg/kg | Freq: Two times a day (BID) | SUBCUTANEOUS | Status: DC
  Filled 2021-01-08: qty 1

## 2021-01-08 MED ORDER — FUROSEMIDE 40 MG PO TABS
40.0000 mg | ORAL_TABLET | Freq: Two times a day (BID) | ORAL | Status: DC
  Administered 2021-01-08 – 2021-01-09 (×3): 40 mg via ORAL
  Filled 2021-01-08 (×3): qty 1

## 2021-01-08 MED ORDER — DOXYCYCLINE HYCLATE 100 MG PO TABS
100.0000 mg | ORAL_TABLET | Freq: Two times a day (BID) | ORAL | Status: DC
  Administered 2021-01-08 – 2021-01-10 (×4): 100 mg via ORAL
  Filled 2021-01-08 (×4): qty 1

## 2021-01-08 MED ORDER — DILTIAZEM HCL 30 MG PO TABS
60.0000 mg | ORAL_TABLET | Freq: Four times a day (QID) | ORAL | Status: DC
  Administered 2021-01-08 – 2021-01-09 (×3): 60 mg via ORAL
  Filled 2021-01-08 (×3): qty 2

## 2021-01-08 MED ORDER — AMOXICILLIN-POT CLAVULANATE 875-125 MG PO TABS
1.0000 | ORAL_TABLET | Freq: Two times a day (BID) | ORAL | Status: DC
  Administered 2021-01-08 – 2021-01-10 (×4): 1 via ORAL
  Filled 2021-01-08 (×4): qty 1

## 2021-01-08 MED ORDER — METOPROLOL TARTRATE 50 MG PO TABS
100.0000 mg | ORAL_TABLET | Freq: Two times a day (BID) | ORAL | Status: DC
  Administered 2021-01-08 – 2021-01-10 (×4): 100 mg via ORAL
  Filled 2021-01-08 (×4): qty 2

## 2021-01-08 MED ORDER — METOPROLOL TARTRATE 50 MG PO TABS
50.0000 mg | ORAL_TABLET | Freq: Two times a day (BID) | ORAL | Status: DC
  Administered 2021-01-08: 50 mg via ORAL
  Filled 2021-01-08: qty 1

## 2021-01-08 MED ORDER — APIXABAN 5 MG PO TABS
5.0000 mg | ORAL_TABLET | Freq: Two times a day (BID) | ORAL | Status: DC
  Administered 2021-01-08 – 2021-01-10 (×5): 5 mg via ORAL
  Filled 2021-01-08 (×5): qty 1

## 2021-01-08 MED ORDER — DIGOXIN 125 MCG PO TABS
0.1250 mg | ORAL_TABLET | Freq: Every day | ORAL | Status: DC
  Administered 2021-01-08 – 2021-01-10 (×3): 0.125 mg via ORAL
  Filled 2021-01-08 (×3): qty 1

## 2021-01-08 NOTE — Progress Notes (Signed)
Report received from previous nurse. Patient sitting up in bed, no acute distress noted.  Denies any issues at this time.  Alert and oriented x4.

## 2021-01-08 NOTE — Progress Notes (Signed)
Mobility Specialist - Progress Note   01/08/21 1400  Mobility  Activity Contraindicated/medical hold  Mobility performed by Mobility specialist    Per discussion with nurse, hold mobility session this date d/t amio IV + elevated HR. Will attempt session another date/time as medically appropriate.    Filiberto Pinks Mobility Specialist 01/08/21, 2:11 PM

## 2021-01-08 NOTE — Progress Notes (Addendum)
PROGRESS NOTE    George Shaw  JHE:[id number] DOB: 1971-08-15 DOA: 01/06/2021 PCP: Lorenso Quarry, NP   Chief complaint.  Palpitation and chest pain. Brief Narrative:  George Shaw a 50 y.o.malewith medical history significant ofrecent admission due to new A fib with RVR on Eliquis, and sCHF with EF 25-30%, HTN, HLD, DM, GERD, CKD-IIIa, tobacco abuse, who present withpalpitation,chest pain and SOB. Upon arrival in the emergency room, her heart rate was within 120s to 140s, blood pressure 74/64 which improved to 100/70. Patient had heart cath on 3/28, normal coronary arteries. 3/29.  Had persistent atrial fibrillation with rapid ventricular response, resumed home dose of metoprolol at 100 mg twice a day.  Also added diltiazem 60 mg every 6 hours, digoxin at 0.125 mg daily.    Assessment & Plan:   Principal Problem:   Atrial fibrillation with RVR (HCC) Active Problems:   Chronic renal failure, stage 3a (HCC)   Type II diabetes mellitus with renal manifestations (HCC)   HTN (hypertension)   HLD (hyperlipidemia)   GERD (gastroesophageal reflux disease)   Chronic systolic CHF (congestive heart failure) (HCC)   Tobacco abuse   Chest pain   Hyponatremia   Elevated troponin   Hypotension   CAP (community acquired pneumonia)  #1.  Paroxysmal atrial fibrillation with rapid ventricle response. Acute on chronic systolic congestive heart failure. Chest pain with mild elevation troponin. Transient hypotension. Patient was given IV Lasix yesterday, he diuresed more than 10 L of urine, volume status much improved.  Lasix changed to oral. Patient has normal coronary arteries per heart cath.  He has nonischemic cardiomyopathy. He continues to have tachycardia with atrial fibrillation.  Amiodarone drip be continued.  I will restart metoprolol at 100 mg twice a day.  His blood pressure more stable now, I will also give short acting calcium channel blocker with diltiazem 60 mg  every 6 hours (patient was taking diltiazem 360 mg daily at home).  Additionally, I will also add digoxin, I am aware that patient had a stage III chronic disease.  The dose of digoxin is pretty low. Resume anticoagulation with Eliquis. Check TSH and free T4.  #2.  Hyponatremia. Chronic kidney disease stage IIIa. Renal function is still stable after diuretics.  3.  Uncontrolled type 2 diabetes with hyperglycemia. Increased dose of Lantus, continue sliding scale insulin.  4. Overweight. BMI 25.65.  5. Possible Right low lobe pneumonia.  CXR showed right low lobe infiltrates, mild elevated procalcitonin. Started Augmentin and doxycycline. Recheck procal in am.     DVT prophylaxis: Eliquis Code Status: Full Family Communication: Parents at bedside. Disposition Plan:  .   Status is: Inpatient  Remains inpatient appropriate because:Inpatient level of care appropriate due to severity of illness   Dispo: The patient is from: Home              Anticipated d/c is to: Home              Patient currently is not medically stable to d/c.   Difficult to place patient No        I/O last 3 completed shifts: In: 2206 [P.O.:2200; I.V.:6] Out: 18563 [Urine:12700] Total I/O In: 820 [P.O.:820] Out: 450 [Urine:450]     Consultants:   Cardiology  Procedures: None  Antimicrobials: None Subjective: Patient still has some palpitation.  No additional chest pain.  Short of breath much improved. Denies any abdominal pain or nausea vomiting. No fever or chills. No dysuria hematuria. No headache or dizziness.  Objective: Vitals:   01/08/21 0428 01/08/21 0850 01/08/21 1027 01/08/21 1124  BP: (!) 132/96 (!) 124/93  (!) 126/98  Pulse: (!) 122 (!) 124  (!) 122  Resp: 18 18  18   Temp: 98.1 F (36.7 C) 97.9 F (36.6 C)  97.8 F (36.6 C)  TempSrc: Oral Oral    SpO2: 96% 100%  99%  Weight:   95.6 kg   Height:        Intake/Output Summary (Last 24 hours) at 01/08/2021 1419 Last  data filed at 01/08/2021 1330 Gross per 24 hour  Intake 2323 ml  Output 8250 ml  Net -5927 ml   Filed Weights   01/07/21 0900 01/07/21 1305 01/08/21 1027  Weight: 100 kg 100 kg 95.6 kg    Examination:  General exam: Appears calm and comfortable  Respiratory system: Clear to auscultation. Respiratory effort normal. Cardiovascular system: Irregular irregular and tachycardiac. No JVD, murmurs, rubs, gallops or clicks. No pedal edema. Gastrointestinal system: Abdomen is nondistended, soft and nontender. No organomegaly or masses felt. Normal bowel sounds heard. Central nervous system: Alert and oriented. No focal neurological deficits. Extremities: Symmetric 5 x 5 power. Skin: No rashes, lesions or ulcers Psychiatry: Judgement and insight appear normal. Mood & affect appropriate.     Data Reviewed: I have personally reviewed following labs and imaging studies  CBC: Recent Labs  Lab 01/06/21 0416 01/07/21 0330 01/08/21 0637  WBC 7.7 4.9 6.7  NEUTROABS 4.8  --  4.5  HGB 16.9 15.4 17.9*  HCT 48.7 44.9 52.0  MCV 85.3 86.5 85.2  PLT 156 146* 197   Basic Metabolic Panel: Recent Labs  Lab 01/06/21 0416 01/06/21 0755 01/06/21 1518 01/06/21 2332 01/07/21 0330 01/08/21 0637  NA 124* 130* 134* 134*  --  131*  K 4.3 4.1 4.5 4.6  --  4.2  CL 91* 94* 101 101  --  96*  CO2 20* 27 25 25   --  25  GLUCOSE 259* 248* 142* 238*  --  223*  BUN 25* 25* 24* 31*  --  27*  CREATININE 1.52* 1.40* 1.23 1.35*  --  1.34*  CALCIUM 9.0 8.3* 8.5* 8.5*  --  9.3  MG 2.2  --   --   --  2.1 1.9   GFR: Estimated Creatinine Clearance: 83.7 mL/min (A) (by C-G formula based on SCr of 1.34 mg/dL (H)). Liver Function Tests: No results for input(s): AST, ALT, ALKPHOS, BILITOT, PROT, ALBUMIN in the last 168 hours. No results for input(s): LIPASE, AMYLASE in the last 168 hours. No results for input(s): AMMONIA in the last 168 hours. Coagulation Profile: Recent Labs  Lab 01/06/21 0457  INR 1.4*    Cardiac Enzymes: No results for input(s): CKTOTAL, CKMB, CKMBINDEX, TROPONINI in the last 168 hours. BNP (last 3 results) No results for input(s): PROBNP in the last 8760 hours. HbA1C: Recent Labs    01/07/21 0330  HGBA1C 8.0*   CBG: Recent Labs  Lab 01/07/21 1422 01/07/21 1648 01/07/21 2131 01/08/21 0849 01/08/21 1123  GLUCAP 174* 221* 315* 319* 224*   Lipid Profile: No results for input(s): CHOL, HDL, LDLCALC, TRIG, CHOLHDL, LDLDIRECT in the last 72 hours. Thyroid Function Tests: Recent Labs    01/06/21 0755  TSH 3.414   Anemia Panel: No results for input(s): VITAMINB12, FOLATE, FERRITIN, TIBC, IRON, RETICCTPCT in the last 72 hours. Sepsis Labs: Recent Labs  Lab 01/07/21 0330  PROCALCITON 0.22    Recent Results (from the past 240 hour(s))  Resp Panel  by RT-PCR (Flu A&B, Covid) Nasopharyngeal Swab     Status: None   Collection Time: 01/06/21  6:46 AM   Specimen: Nasopharyngeal Swab; Nasopharyngeal(NP) swabs in vial transport medium  Result Value Ref Range Status   SARS Coronavirus 2 by RT PCR NEGATIVE NEGATIVE Final    Comment: (NOTE) SARS-CoV-2 target nucleic acids are NOT DETECTED.  The SARS-CoV-2 RNA is generally detectable in upper respiratory specimens during the acute phase of infection. The lowest concentration of SARS-CoV-2 viral copies this assay can detect is 138 copies/mL. A negative result does not preclude SARS-Cov-2 infection and should not be used as the sole basis for treatment or other patient management decisions. A negative result may occur with  improper specimen collection/handling, submission of specimen other than nasopharyngeal swab, presence of viral mutation(s) within the areas targeted by this assay, and inadequate number of viral copies(<138 copies/mL). A negative result must be combined with clinical observations, patient history, and epidemiological information. The expected result is Negative.  Fact Sheet for Patients:   BloggerCourse.com  Fact Sheet for Healthcare Providers:  SeriousBroker.it  This test is no t yet approved or cleared by the [place] FDA and  has been authorized for detection and/or diagnosis of SARS-CoV-2 by FDA under an Emergency Use Authorization (EUA). This EUA will remain  in effect (meaning this test can be used) for the duration of the COVID-19 declaration under Section 564(b)(1) of the Act, 21 U.S.C.section 360bbb-3(b)(1), unless the authorization is terminated  or revoked sooner.       Influenza A by PCR NEGATIVE NEGATIVE Final   Influenza B by PCR NEGATIVE NEGATIVE Final    Comment: (NOTE) The Xpert Xpress SARS-CoV-2/FLU/RSV plus assay is intended as an aid in the diagnosis of influenza from Nasopharyngeal swab specimens and should not be used as a sole basis for treatment. Nasal washings and aspirates are unacceptable for Xpert Xpress SARS-CoV-2/FLU/RSV testing.  Fact Sheet for Patients: BloggerCourse.com  Fact Sheet for Healthcare Providers: SeriousBroker.it  This test is not yet approved or cleared by the [place] FDA and has been authorized for detection and/or diagnosis of SARS-CoV-2 by FDA under an Emergency Use Authorization (EUA). This EUA will remain in effect (meaning this test can be used) for the duration of the COVID-19 declaration under Section 564(b)(1) of the Act, 21 U.S.C. section 360bbb-3(b)(1), unless the authorization is terminated or revoked.  Performed at [facility], [address] [place], [place] [address]          Radiology Studies: CARDIAC CATHETERIZATION  Result Date: 01/07/2021  Severely depressed left ventricular function ejection fraction of around 25%  Normal coronaries  Conclusion Normal coronaries Severely depressed left ventricular function EF around 25% globally Nonischemic cardiomyopathy         Scheduled Meds: . apixaban  5 mg Oral BID  . digoxin  0.125 mg Oral Daily  . diltiazem  60 mg Oral Q6H  . furosemide  40 mg Oral BID  . insulin aspart  0-5 Units Subcutaneous QHS  . insulin aspart  0-9 Units Subcutaneous TID WC  . insulin glargine  16 Units Subcutaneous QHS  . magnesium oxide  400 mg Oral Daily  . metoprolol tartrate  100 mg Oral BID  . nicotine  21 mg Transdermal Daily  . omeprazole  20 mg Oral Daily  . rosuvastatin  10 mg Oral Daily  . sodium chloride flush  3 mL Intravenous Q12H   Continuous Infusions: . amiodarone 30 mg/hr (01/08/21 1016)  LOS: 2 days    Time spent: 35 minutes    Marrion Coy, MD [facility]   To contact the attending provider between 7A-7P or the covering provider during after hours 7P-7A, please log into the web site [email or web address] and access using universal [place] password for that web site. If you do not have the password, please call the hospital operator.  01/08/2021, 2:19 PM

## 2021-01-09 ENCOUNTER — Inpatient Hospital Stay: Payer: BC Managed Care – PPO | Admitting: Anesthesiology

## 2021-01-09 ENCOUNTER — Encounter
Admission: EM | Disposition: A | Discharge status: Home / Self Care | Payer: Self-pay | Source: Home / Self Care | Attending: Internal Medicine

## 2021-01-09 ENCOUNTER — Inpatient Hospital Stay (HOSPITAL_COMMUNITY)
Admit: 2021-01-09 | Discharge: 2021-01-09 | Disposition: A | Discharge status: Home / Self Care | Payer: BC Managed Care – PPO | Attending: Cardiovascular Disease | Admitting: Cardiovascular Disease

## 2021-01-09 ENCOUNTER — Encounter: Payer: Self-pay | Admitting: Internal Medicine

## 2021-01-09 ENCOUNTER — Other Ambulatory Visit: Payer: Self-pay

## 2021-01-09 ENCOUNTER — Ambulatory Visit: Admit: 2021-01-09 | Payer: BC Managed Care – PPO | Admitting: Cardiology

## 2021-01-09 DIAGNOSIS — I4891 Unspecified atrial fibrillation: Secondary | ICD-10-CM

## 2021-01-09 DIAGNOSIS — J189 Pneumonia, unspecified organism: Secondary | ICD-10-CM

## 2021-01-09 DIAGNOSIS — I361 Nonrheumatic tricuspid (valve) insufficiency: Secondary | ICD-10-CM

## 2021-01-09 DIAGNOSIS — I4892 Unspecified atrial flutter: Secondary | ICD-10-CM

## 2021-01-09 DIAGNOSIS — I340 Nonrheumatic mitral (valve) insufficiency: Secondary | ICD-10-CM | POA: Diagnosis not present

## 2021-01-09 DIAGNOSIS — I5023 Acute on chronic systolic (congestive) heart failure: Secondary | ICD-10-CM | POA: Diagnosis not present

## 2021-01-09 DIAGNOSIS — R079 Chest pain, unspecified: Secondary | ICD-10-CM

## 2021-01-09 HISTORY — PX: CARDIOVERSION: SHX1299

## 2021-01-09 HISTORY — PX: TEE WITHOUT CARDIOVERSION: SHX5443

## 2021-01-09 LAB — BASIC METABOLIC PANEL
Anion gap: 14 (ref 5–15)
BUN: 27 mg/dL — ABNORMAL HIGH (ref 6–20)
CO2: 20 mmol/L — ABNORMAL LOW (ref 22–32)
Calcium: 9.3 mg/dL (ref 8.9–10.3)
Chloride: 97 mmol/L — ABNORMAL LOW (ref 98–111)
Creatinine, Ser: 1.24 mg/dL (ref 0.61–1.24)
GFR, Estimated: >60 mL/min (ref >60)
Glucose, Bld: 197 mg/dL — ABNORMAL HIGH (ref 70–99)
Potassium: 4.5 mmol/L (ref 3.5–5.1)
Sodium: 131 mmol/L — ABNORMAL LOW (ref 135–145)

## 2021-01-09 LAB — T4, FREE: Free T4: 1.19 ng/dL — ABNORMAL HIGH (ref 0.61–1.12)

## 2021-01-09 LAB — CBC WITH DIFFERENTIAL/PLATELET
Abs Immature Granulocytes: 0.04 10*3/uL (ref 0.00–0.07)
Basophils Absolute: 0.1 10*3/uL (ref 0.0–0.1)
Basophils Relative: 1 %
Eosinophils Absolute: 0.2 10*3/uL (ref 0.0–0.5)
Eosinophils Relative: 2 %
HCT: 51.6 % (ref 39.0–52.0)
Hemoglobin: 17.8 g/dL — ABNORMAL HIGH (ref 13.0–17.0)
Immature Granulocytes: 0 %
Lymphocytes Relative: 20 %
Lymphs Abs: 1.8 10*3/uL (ref 0.7–4.0)
MCH: 29.6 pg (ref 26.0–34.0)
MCHC: 34.5 g/dL (ref 30.0–36.0)
MCV: 85.7 fL (ref 80.0–100.0)
Monocytes Absolute: 1 10*3/uL (ref 0.1–1.0)
Monocytes Relative: 11 %
Neutro Abs: 6 10*3/uL (ref 1.7–7.7)
Neutrophils Relative %: 66 %
Platelets: 216 10*3/uL (ref 150–400)
RBC: 6.02 MIL/uL — ABNORMAL HIGH (ref 4.22–5.81)
RDW: 13.3 % (ref 11.5–15.5)
WBC: 9 10*3/uL (ref 4.0–10.5)
nRBC: 0 % (ref 0.0–0.2)

## 2021-01-09 LAB — MAGNESIUM: Magnesium: 1.9 mg/dL (ref 1.7–2.4)

## 2021-01-09 LAB — GLUCOSE, CAPILLARY
Glucose-Capillary: 300 mg/dL — ABNORMAL HIGH (ref 70–99)
Glucose-Capillary: 234 mg/dL — ABNORMAL HIGH (ref 70–99)
Glucose-Capillary: 142 mg/dL — ABNORMAL HIGH (ref 70–99)
Glucose-Capillary: 143 mg/dL — ABNORMAL HIGH (ref 70–99)
Glucose-Capillary: 343 mg/dL — ABNORMAL HIGH (ref 70–99)

## 2021-01-09 LAB — TSH: TSH: 3.187 u[IU]/mL (ref 0.350–4.500)

## 2021-01-09 LAB — DIGOXIN LEVEL: Digoxin Level: 0.4 ng/mL — ABNORMAL LOW (ref 0.8–2.0)

## 2021-01-09 LAB — PROTIME-INR
INR: 1.1 (ref 0.8–1.2)
Prothrombin Time: 14 seconds (ref 11.4–15.2)

## 2021-01-09 LAB — PROCALCITONIN: Procalcitonin: 0.12 ng/mL

## 2021-01-09 SURGERY — ECHOCARDIOGRAM, TRANSESOPHAGEAL
Anesthesia: General

## 2021-01-09 MED ORDER — SODIUM CHLORIDE 0.9 % IV SOLN: INTRAVENOUS | Status: DC

## 2021-01-09 MED ORDER — BUTAMBEN-TETRACAINE-BENZOCAINE 2-2-14 % EX AERO
INHALATION_SPRAY | CUTANEOUS | Status: AC
  Filled 2021-01-09: qty 5

## 2021-01-09 MED ORDER — AMIODARONE HCL 200 MG PO TABS: 200.0000 mg | ORAL_TABLET | Freq: Two times a day (BID) | ORAL | Status: DC

## 2021-01-09 MED ORDER — SACUBITRIL-VALSARTAN 24-26 MG PO TABS
0.5000 | ORAL_TABLET | Freq: Two times a day (BID) | ORAL | Status: DC
  Administered 2021-01-10: 0.5 via ORAL
  Filled 2021-01-09 (×2): qty 1

## 2021-01-09 MED ORDER — AMIODARONE HCL 200 MG PO TABS: 400.0000 mg | ORAL_TABLET | Freq: Two times a day (BID) | ORAL | Status: DC

## 2021-01-09 MED ORDER — MIDAZOLAM HCL 2 MG/2ML IJ SOLN
INTRAMUSCULAR | Status: AC
  Filled 2021-01-09: qty 2

## 2021-01-09 MED ORDER — SODIUM CHLORIDE FLUSH 0.9 % IV SOLN
INTRAVENOUS | Status: AC
  Filled 2021-01-09: qty 10

## 2021-01-09 MED ORDER — PROPOFOL 10 MG/ML IV BOLUS
INTRAVENOUS | Status: AC
  Filled 2021-01-09: qty 20

## 2021-01-09 MED ORDER — FUROSEMIDE 40 MG PO TABS
40.0000 mg | ORAL_TABLET | Freq: Every day | ORAL | Status: DC
  Administered 2021-01-10: 40 mg via ORAL
  Filled 2021-01-09: qty 1

## 2021-01-09 MED ORDER — AMIODARONE HCL 200 MG PO TABS
400.0000 mg | ORAL_TABLET | Freq: Two times a day (BID) | ORAL | Status: DC
  Administered 2021-01-09 – 2021-01-10 (×2): 400 mg via ORAL
  Filled 2021-01-09 (×3): qty 2

## 2021-01-09 MED ORDER — LIDOCAINE VISCOUS HCL 2 % MT SOLN
OROMUCOSAL | Status: AC
  Filled 2021-01-09: qty 15

## 2021-01-09 MED ORDER — PROPOFOL 10 MG/ML IV BOLUS
INTRAVENOUS | Status: DC | PRN
  Administered 2021-01-09: 10 mg via INTRAVENOUS
  Administered 2021-01-09 (×2): 20 mg via INTRAVENOUS

## 2021-01-09 MED ORDER — MIDAZOLAM HCL 2 MG/2ML IJ SOLN
INTRAMUSCULAR | Status: DC | PRN
  Administered 2021-01-09: 2 mg via INTRAVENOUS

## 2021-01-09 NOTE — Interval H&P Note (Signed)
History and Physical Interval Note:  01/09/2021 3:59 PM  George Shaw  has presented today for surgery, with the diagnosis of atrial flutter.  The various methods of treatment have been discussed with the patient and family. After consideration of risks, benefits and other options for treatment, the patient has consented to  Procedure(s) with comments: TRANSESOPHAGEAL ECHOCARDIOGRAM (TEE) (N/A) - Dr. Kirke Corin ( TEE/DCCV) CARDIOVERSION (N/A) as a surgical intervention.  The patient's history has been reviewed, patient examined, no change in status, stable for surgery.  I have reviewed the patient's chart and labs.  Questions were answered to the patient's satisfaction.     [name]

## 2021-01-09 NOTE — Progress Notes (Signed)
Clarified with dr. Kirke Corin. Okay to stop amiodarone drip now and give 400 mg scheduled now. Hold scheduled 2200 dose.

## 2021-01-09 NOTE — H&P (View-Only) (Signed)
Electrophysiology Consultation:   Patient ID: George Shaw MRN: [id number]; DOB: September 09, 1971  Admit date: 01/06/2021 Date of Consult: 01/09/2021  PCP:  Lorenso Quarry, NP   [facility]  Electrophysiologist:  Lanier Prude, MD    History of Present Illness:   George Shaw is a 50 year old man with a history of CKD 3A, prior alcohol use, hypertension, hyperlipidemia, diabetes and nonischemic cardiomyopathy with an ejection fraction of 25 to 30% who was admitted with atrial fibrillation with rapid ventricular rates and chest pain.  Because of the chest pain he was taken for left heart catheterization which showed no significant coronary artery disease.  Since admission, he has been diuresed with IV Lasix and had a significant diuresis of greater than 10 L with a dramatic improvement in his symptoms.  He tells me that in the past his atrial fibrillation has come and gone.  He is symptomatic with rapid heartbeats and sometimes chest discomfort when his heart rates get "near 200".  He is on Eliquis for stroke prophylaxis.  When he initially presented to the hospital he had a heart rate of 217 bpm with hypotension and was urgently cardioverted.     Past Medical History:  Diagnosis Date  . Arrhythmia    atrial fibrillation  . CHF (congestive heart failure) (HCC)   . Diabetes mellitus without complication (HCC)   . Hypertension     Past Surgical History:  Procedure Laterality Date  . LEFT HEART CATH AND CORONARY ANGIOGRAPHY N/A 01/07/2021   Procedure: LEFT HEART CATH AND CORONARY ANGIOGRAPHY with possible PCI and stent;  Surgeon: Alwyn Pea, MD;  Location: [facility] INVASIVE CV LAB;  Service: Cardiovascular;  Laterality: N/A;     Home Medications:  Prior to Admission medications   Medication Sig Start Date End Date Taking? Authorizing Provider  apixaban (ELIQUIS) 5 MG TABS tablet Take 1 tablet (5 mg total) by mouth 2 (two) times daily. 12/27/20  Yes  Lewie Chamber, MD  azithromycin (ZITHROMAX) 250 MG tablet Take 250-500 mg by mouth daily. 01/02/21  Yes [provider]  diltiazem (CARDIZEM CD) 360 MG 24 hr capsule Take 1 capsule (360 mg total) by mouth daily. 12/28/20  Yes Lewie Chamber, MD  furosemide (LASIX) 20 MG tablet Take 1 tablet (20 mg total) by mouth daily. 12/27/20 12/27/21 Yes Lewie Chamber, MD  JANUVIA 100 MG tablet Take 100 mg by mouth daily. 12/10/20  Yes [provider]  magnesium oxide (MAG-OX) 400 MG tablet Take 1 tablet by mouth daily. 01/04/21 01/04/22 Yes [provider]  metoprolol tartrate (LOPRESSOR) 100 MG tablet Take 1 tablet (100 mg total) by mouth 2 (two) times daily. Patient taking differently: Take 50 mg by mouth 2 (two) times daily. 12/27/20  Yes Lewie Chamber, MD  omeprazole (PRILOSEC OTC) 20 MG tablet Take 20 mg by mouth daily.   Yes [provider]  rosuvastatin (CRESTOR) 10 MG tablet Take 10 mg by mouth daily. 12/11/20  Yes [provider]  insulin glargine (LANTUS) 100 UNIT/ML Solostar Pen Inject 10 Units into the skin every evening. 01/04/21 01/04/22  [provider]  metFORMIN (GLUCOPHAGE-XR) 500 MG 24 hr tablet Take 500 mg by mouth daily after supper. 12/11/20   [provider]    Inpatient Medications: Scheduled Meds: . amoxicillin-clavulanate  1 tablet Oral Q12H  . apixaban  5 mg Oral BID  . digoxin  0.125 mg Oral Daily  . diltiazem  60 mg Oral Q6H  . doxycycline  100 mg  Oral Q12H  . furosemide  40 mg Oral BID  . insulin aspart  0-5 Units Subcutaneous QHS  . insulin aspart  0-9 Units Subcutaneous TID WC  . insulin glargine  16 Units Subcutaneous QHS  . magnesium oxide  400 mg Oral Daily  . metoprolol tartrate  100 mg Oral BID  . nicotine  21 mg Transdermal Daily  . omeprazole  20 mg Oral Daily  . rosuvastatin  10 mg Oral Daily  . sodium chloride flush  3 mL Intravenous Q12H   Continuous Infusions: . amiodarone 30 mg/hr (01/08/21 2333)    PRN Meds: acetaminophen, acetaminophen, albuterol, dextromethorphan-guaiFENesin, metoprolol tartrate, ondansetron (ZOFRAN) IV, ondansetron (ZOFRAN) IV  Allergies:   No Known Allergies  Social History:   Social History   Socioeconomic History  . Marital status: Legally Separated    Spouse name: Not on file  . Number of children: Not on file  . Years of education: Not on file  . Highest education level: Not on file  Occupational History  . Not on file  Tobacco Use  . Smoking status: Light Tobacco Smoker    Types: Cigars  . Smokeless tobacco: Never Used  Substance and Sexual Activity  . Alcohol use: Not Currently  . Drug use: Not Currently  . Sexual activity: Yes  Other Topics Concern  . Not on file  Social History Narrative  . Not on file   Social Determinants of Health   Financial Resource Strain: Not on file  Food Insecurity: Not on file  Transportation Needs: Not on file  Physical Activity: Not on file  Stress: Not on file  Social Connections: Not on file  Intimate Partner Violence: Not on file    Family History:    Family History  Problem Relation Age of Onset  . Diabetes Mellitus II Mother   . Diabetes Mellitus II Father      ROS:  Please see the history of present illness.   All other ROS reviewed and negative.     Physical Exam/Data:   Vitals:   01/08/21 2031 01/08/21 2330 01/09/21 0517 01/09/21 0812  BP: 111/84 (!) 114/93 109/85 109/77  Pulse: (!) 122 (!) 112 (!) 112 (!) 110  Resp: (!) 22 20 20 18   Temp: 98.2 F (36.8 C) 98.7 F (37.1 C) 98.2 F (36.8 C) (!) 97.4 F (36.3 C)  TempSrc: Oral Oral  Oral  SpO2: 100% 97% 99% 100%  Weight:      Height:        Intake/Output Summary (Last 24 hours) at 01/09/2021 0930 Last data filed at 01/09/2021 0700 Gross per 24 hour  Intake 1760 ml  Output 5525 ml  Net -3765 ml   Last 3 Weights 01/08/2021 01/07/2021 01/07/2021  Weight (lbs) 210 lb 11.2 oz 220 lb 7.4 oz 220 lb 8 oz  Weight (kg) 95.573 kg  100 kg 100.018 kg     Body mass index is 25.65 kg/m.   General:  Well nourished, well developed, in no acute distress HEENT: normal Lymph: no adenopathy Neck: no JVD Endocrine:  No thryomegaly Vascular: No carotid bruits; FA pulses 2+ bilaterally without bruits  Cardiac:  normal S1, S2; regular rhythm, tachycardic; no murmur  Lungs:  clear to auscultation bilaterally, no wheezing, rhonchi or rales  Abd: soft, nontender, no hepatomegaly  Ext: no edema Musculoskeletal:  No deformities, BUE and BLE strength normal and equal Skin: warm and dry  Neuro:  CNs 2-12 intact, no focal abnormalities noted Psych:  Normal  affect   Telemetry personally reviewed and shows atrial flutter with what appears to be 2-1 AV conduction.  Relevant CV Studies:  01/06/2021 ECG from ER   01/06/2021 ECG post cardioversion  Sinus. LAA.   01/06/2021 ECG AF   01/07/2021 ECG AFL   Laboratory Data:  High Sensitivity Troponin:   Recent Labs  Lab 01/06/21 0416 01/06/21 0609 01/06/21 0755 01/06/21 1054 01/06/21 1343  TROPONINIHS 13 55* 146* 244* 268*     Chemistry Recent Labs  Lab 01/06/21 2332 01/08/21 0637 01/09/21 0549  NA 134* 131* 131*  K 4.6 4.2 4.5  CL 101 96* 97*  CO2 25 25 20*  GLUCOSE 238* 223* 197*  BUN 31* 27* 27*  CREATININE 1.35* 1.34* 1.24  CALCIUM 8.5* 9.3 9.3  GFRNONAA >60 >60 >60  ANIONGAP 8 10 14     No results for input(s): PROT, ALBUMIN, AST, ALT, ALKPHOS, BILITOT in the last 168 hours. Hematology Recent Labs  Lab 01/07/21 0330 01/08/21 0637 01/09/21 0549  WBC 4.9 6.7 9.0  RBC 5.19 6.10* 6.02*  HGB 15.4 17.9* 17.8*  HCT 44.9 52.0 51.6  MCV 86.5 85.2 85.7  MCH 29.7 29.3 29.6  MCHC 34.3 34.4 34.5  RDW 13.2 13.4 13.3  PLT 146* 197 216   BNP Recent Labs  Lab 01/06/21 0416  BNP 1,638.8*    DDimer No results for input(s): DDIMER in the last 168 hours.   Radiology/Studies:  CARDIAC CATHETERIZATION  Result Date: 01/07/2021  Severely depressed left  ventricular function ejection fraction of around 25%  Normal coronaries  Conclusion Normal coronaries Severely depressed left ventricular function EF around 25% globally Nonischemic cardiomyopathy   DG Chest Port 1 View  Result Date: 01/06/2021 CLINICAL DATA:  Chest pain, AFib EXAM: PORTABLE CHEST 1 VIEW COMPARISON:  12/23/2020 FINDINGS: Mild right basilar opacity, new, atelectasis versus pneumonia. No pleural effusion or pneumothorax. The heart is top-normal in size. IMPRESSION: Mild right basilar opacity, new, atelectasis versus pneumonia. Electronically Signed   By: 12/25/2020 M.D.   On: 01/06/2021 09:02     Assessment and Plan:   1. Symptomatic atrial fibrillation and flutter Patient appears to have highly symptomatic episodes of atrial fibrillation and flutter.  When he first arrived to the hospital he was in a very rapidly conducting atrial flutter versus SVT.  It is possible that he has an SVT that is triggering episodes of atrial fibrillation/flutter.  Management of his arrhythmias should be geared towards addressing both of these possibilities.  He has been anticoagulated this hospitalization but it was interrupted for a left heart catheterization which did not show significant coronary obstruction.  Given his left ventricular dysfunction, I would favor a rhythm control strategy for Mr. Fleischer.  We discussed the various options including antiarrhythmic therapy and ablation therapy.  His antiarrhythmic therapy options are limited long-term given his chronic kidney disease and left ventricular dysfunction.  He is currently on amiodarone which was started when he first arrived.  I think this is a reasonable strategy in the short-term but is not ideal for long-term rhythm control strategy given his young age.  I would favor transitioning this to an oral regimen after cardioversion.  We will plan to start amiodarone 400 mg by mouth twice daily tonight (January 09, 2021).  He will continue  twice daily dosing for a total of 5 days.  He will then transition to 400 mg by mouth once daily for 5 days.  He would then transition to 200 mg by mouth once  daily.  He will continue on the 200 mg daily dose until I see him in follow-up.  I will plan to see him in follow-up in 6 to 8 weeks to reassess his heart rhythm.  We will also discuss EP study with atrial fibrillation/flutter ablation at that appointment in more detail.  I think this is the best long-term strategy for him which would allow him to avoid long-term exposure to amiodarone.  EP study and ablation will also allow me to address the possibility that there is an SVT triggering episodes of atrial fibrillation/flutter.  I did discuss ablation with the patient during today's visit.  His parents were here for that conversation.  I discussed the risks, expected recovery time and efficacy of ablation compared to antiarrhythmic therapy.  We will plan to cardiovert him today with a TEE to help restore normal rhythm now that he has been loaded with amiodarone.  TEE/cardioversion was discussed in detail with the patient and he is agreeable to proceed.   For questions or updates, please contact [facility] Please consult [email or web address] for contact info under    Signed, Lanier Prude, MD  01/09/2021 9:30 AM

## 2021-01-09 NOTE — Progress Notes (Signed)
      [facility] has been requested to perform a transesophageal echocardiogram and cardioversion on 01/09/21 at ~2:00PM for atrial flutter.  Patient was seen and evaluated earlier today by electrophysiology / Dr. Lalla Brothers, MD/before the procedure.  TEE/DCCV ordered and scheduled to occur today 01/09/2021 at approximately 2 PM with Dr. Kirke Corin, MD.  Anesthesia has been notified of the procedure.  For clarification, [facility] is following this patient during admission as per initial consult by Dr. Juliann Pares.   Lennon Alstrom, PA-C 01/09/2021 10:49 AM

## 2021-01-09 NOTE — Transfer of Care (Signed)
Immediate Anesthesia Transfer of Care Note  Patient: George Shaw  Procedure(s) Performed: TRANSESOPHAGEAL ECHOCARDIOGRAM (TEE) (N/A ) CARDIOVERSION (N/A )  Patient Location: PACU  Anesthesia Type:General  Level of Consciousness: awake, alert  and oriented  Airway & Oxygen Therapy: Patient Spontanous Breathing  Post-op Assessment: Report given to RN and Post -op Vital signs reviewed and stable  Post vital signs: Reviewed and stable  Last Vitals:  Vitals Value Taken Time  BP 86/59 01/09/21 1559  Temp    Pulse 62 01/09/21 1600  Resp 14 01/09/21 1600  SpO2 98 % 01/09/21 1600  Vitals shown include unvalidated device data.  Last Pain:  Vitals:   01/09/21 1421  TempSrc:   PainSc: 0-No pain      Patients Stated Pain Goal: 0 (01/08/21 1124)  Complications: No complications documented.

## 2021-01-09 NOTE — Progress Notes (Addendum)
[facility]    SUBJECTIVE: Status post TEE cardioversion by Dr. Kirke Corin patient now in sinus tachycardia of around 110 feels reasonably well would like to consider going home soon so we will start making adjustments in medication and switch amiodarone to p.o. prescribe beta-blockers ACE ARB or Entresto and diuretics.  Patient denies any chest pain currently   Vitals:   01/09/21 0517 01/09/21 0812 01/09/21 1100 01/09/21 1208  BP: 109/85 109/77  93/72  Pulse: (!) 112 (!) 110  (!) 110  Resp: 20 18  18   Temp: 98.2 F (36.8 C) (!) 97.4 F (36.3 C)  (!) 97.5 F (36.4 C)  TempSrc:  Oral  Oral  SpO2: 99% 100%  98%  Weight:   95.3 kg   Height:         Intake/Output Summary (Last 24 hours) at 01/09/2021 1356 Last data filed at 01/09/2021 0700 Gross per 24 hour  Intake 940 ml  Output 5075 ml  Net -4135 ml      PHYSICAL EXAM  General: Well developed, well nourished, in no acute distress HEENT:  Normocephalic and atramatic Neck:  No JVD.  Lungs: Clear bilaterally to auscultation and percussion. Heart: Tachycardia. Normal S1 and S2 without gallops or murmurs.  Abdomen: Bowel sounds are positive, abdomen soft and non-tender  Msk:  Back normal, normal gait. Normal strength and tone for age. Extremities: No clubbing, cyanosis or edema.   Neuro: Alert and oriented X 3. Psych:  Good affect, responds appropriately   LABS: Basic Metabolic Panel: Recent Labs    01/08/21 0637 01/09/21 0549  NA 131* 131*  K 4.2 4.5  CL 96* 97*  CO2 25 20*  GLUCOSE 223* 197*  BUN 27* 27*  CREATININE 1.34* 1.24  CALCIUM 9.3 9.3  MG 1.9 1.9   Liver Function Tests: No results for input(s): AST, ALT, ALKPHOS, BILITOT, PROT, ALBUMIN in the last 72 hours. No results for input(s): LIPASE, AMYLASE in the last 72 hours. CBC: Recent Labs    01/08/21 0637 01/09/21 0549  WBC 6.7 9.0  NEUTROABS 4.5 6.0  HGB 17.9* 17.8*  HCT 52.0 51.6  MCV 85.2 85.7  PLT 197 216   Cardiac Enzymes: No results for  input(s): CKTOTAL, CKMB, CKMBINDEX, TROPONINI in the last 72 hours. BNP: Invalid input(s): POCBNP D-Dimer: No results for input(s): DDIMER in the last 72 hours. Hemoglobin A1C: Recent Labs    01/07/21 0330  HGBA1C 8.0*   Fasting Lipid Panel: No results for input(s): CHOL, HDL, LDLCALC, TRIG, CHOLHDL, LDLDIRECT in the last 72 hours. Thyroid Function Tests: Recent Labs    01/09/21 0549  TSH 3.187   Anemia Panel: No results for input(s): VITAMINB12, FOLATE, FERRITIN, TIBC, IRON, RETICCTPCT in the last 72 hours.  No results found.   Echo TEE today suggests no clot in the atrial appendage severely depressed left ventricular function EF around 25%  TELEMETRY: Initially patient was in atrial fibrillation rapid [place] response status post cardioversion now atrial flutter at around 110  ASSESSMENT AND PLAN:  Principal Problem:   Atrial fibrillation with RVR (HCC) Active Problems:   Chronic renal failure, stage 3a (HCC)   Type II diabetes mellitus with renal manifestations (HCC)   HTN (hypertension)   HLD (hyperlipidemia)   GERD (gastroesophageal reflux disease)   Acute on chronic systolic CHF (congestive heart failure) (HCC)   Tobacco abuse   Chest pain   Hyponatremia   Elevated troponin   Hypotension   CAP (community acquired pneumonia)    Plan  Arrhythmias SVT atrial fibrillation status post TEE /cardioversion continue amiodarone and anticoagulation Agree with and appreciate EP input.  Patient may ultimately need ablation for SVT and possibly for A. fib a flutter Nonischemic cardiomyopathy is probably underlying problem of unclear etiology recommend continued heart failure therapy ARB ACE or Entresto beta-blocker diuretics Continue diabetes management and control Consider nephrology input for early renal insufficiency Advised patient to refrain from tobacco abuse Relative hypotension which limits our medication choices patient is on metoprolol Lasix I would like to add  Entresto if his blood pressure can tolerate it Ultimately his cardiomyopathy needs to be addressed and Sherryll Burger is the best option but with having trouble with controlling his heart rate which is why he is on 200 mg of metoprolol we currently have him on amiodarone as well as digoxin.  I would not add Cardizem because of hypotension Patient should follow-up with EP as an outpatient as well as heart failure clinic If he is able to tolerate this medical regimen and keep his heart rate around 100 then he may be able to be discharged home and follow-up as an outpatient  Alwyn Pea, MD 01/09/2021 1:56 PM

## 2021-01-09 NOTE — Consult Note (Signed)
Electrophysiology Consultation:   Patient ID: George Shaw MRN: [id number]; DOB: September 09, 1971  Admit date: 01/06/2021 Date of Consult: 01/09/2021  PCP:  Lorenso Quarry, NP   [facility]  Electrophysiologist:  Lanier Prude, MD    History of Present Illness:   George Shaw is a 50 year old man with a history of CKD 3A, prior alcohol use, hypertension, hyperlipidemia, diabetes and nonischemic cardiomyopathy with an ejection fraction of 25 to 30% who was admitted with atrial fibrillation with rapid ventricular rates and chest pain.  Because of the chest pain he was taken for left heart catheterization which showed no significant coronary artery disease.  Since admission, he has been diuresed with IV Lasix and had a significant diuresis of greater than 10 L with a dramatic improvement in his symptoms.  He tells me that in the past his atrial fibrillation has come and gone.  He is symptomatic with rapid heartbeats and sometimes chest discomfort when his heart rates get "near 200".  He is on Eliquis for stroke prophylaxis.  When he initially presented to the hospital he had a heart rate of 217 bpm with hypotension and was urgently cardioverted.     Past Medical History:  Diagnosis Date  . Arrhythmia    atrial fibrillation  . CHF (congestive heart failure) (HCC)   . Diabetes mellitus without complication (HCC)   . Hypertension     Past Surgical History:  Procedure Laterality Date  . LEFT HEART CATH AND CORONARY ANGIOGRAPHY N/A 01/07/2021   Procedure: LEFT HEART CATH AND CORONARY ANGIOGRAPHY with possible PCI and stent;  Surgeon: Alwyn Pea, MD;  Location: [facility] INVASIVE CV LAB;  Service: Cardiovascular;  Laterality: N/A;     Home Medications:  Prior to Admission medications   Medication Sig Start Date End Date Taking? Authorizing Provider  apixaban (ELIQUIS) 5 MG TABS tablet Take 1 tablet (5 mg total) by mouth 2 (two) times daily. 12/27/20  Yes  Lewie Chamber, MD  azithromycin (ZITHROMAX) 250 MG tablet Take 250-500 mg by mouth daily. 01/02/21  Yes [provider]  diltiazem (CARDIZEM CD) 360 MG 24 hr capsule Take 1 capsule (360 mg total) by mouth daily. 12/28/20  Yes Lewie Chamber, MD  furosemide (LASIX) 20 MG tablet Take 1 tablet (20 mg total) by mouth daily. 12/27/20 12/27/21 Yes Lewie Chamber, MD  JANUVIA 100 MG tablet Take 100 mg by mouth daily. 12/10/20  Yes [provider]  magnesium oxide (MAG-OX) 400 MG tablet Take 1 tablet by mouth daily. 01/04/21 01/04/22 Yes [provider]  metoprolol tartrate (LOPRESSOR) 100 MG tablet Take 1 tablet (100 mg total) by mouth 2 (two) times daily. Patient taking differently: Take 50 mg by mouth 2 (two) times daily. 12/27/20  Yes Lewie Chamber, MD  omeprazole (PRILOSEC OTC) 20 MG tablet Take 20 mg by mouth daily.   Yes [provider]  rosuvastatin (CRESTOR) 10 MG tablet Take 10 mg by mouth daily. 12/11/20  Yes [provider]  insulin glargine (LANTUS) 100 UNIT/ML Solostar Pen Inject 10 Units into the skin every evening. 01/04/21 01/04/22  [provider]  metFORMIN (GLUCOPHAGE-XR) 500 MG 24 hr tablet Take 500 mg by mouth daily after supper. 12/11/20   [provider]    Inpatient Medications: Scheduled Meds: . amoxicillin-clavulanate  1 tablet Oral Q12H  . apixaban  5 mg Oral BID  . digoxin  0.125 mg Oral Daily  . diltiazem  60 mg Oral Q6H  . doxycycline  100 mg  Oral Q12H  . furosemide  40 mg Oral BID  . insulin aspart  0-5 Units Subcutaneous QHS  . insulin aspart  0-9 Units Subcutaneous TID WC  . insulin glargine  16 Units Subcutaneous QHS  . magnesium oxide  400 mg Oral Daily  . metoprolol tartrate  100 mg Oral BID  . nicotine  21 mg Transdermal Daily  . omeprazole  20 mg Oral Daily  . rosuvastatin  10 mg Oral Daily  . sodium chloride flush  3 mL Intravenous Q12H   Continuous Infusions: . amiodarone 30 mg/hr (01/08/21 2333)    PRN Meds: acetaminophen, acetaminophen, albuterol, dextromethorphan-guaiFENesin, metoprolol tartrate, ondansetron (ZOFRAN) IV, ondansetron (ZOFRAN) IV  Allergies:   No Known Allergies  Social History:   Social History   Socioeconomic History  . Marital status: Legally Separated    Spouse name: Not on file  . Number of children: Not on file  . Years of education: Not on file  . Highest education level: Not on file  Occupational History  . Not on file  Tobacco Use  . Smoking status: Light Tobacco Smoker    Types: Cigars  . Smokeless tobacco: Never Used  Substance and Sexual Activity  . Alcohol use: Not Currently  . Drug use: Not Currently  . Sexual activity: Yes  Other Topics Concern  . Not on file  Social History Narrative  . Not on file   Social Determinants of Health   Financial Resource Strain: Not on file  Food Insecurity: Not on file  Transportation Needs: Not on file  Physical Activity: Not on file  Stress: Not on file  Social Connections: Not on file  Intimate Partner Violence: Not on file    Family History:    Family History  Problem Relation Age of Onset  . Diabetes Mellitus II Mother   . Diabetes Mellitus II Father      ROS:  Please see the history of present illness.   All other ROS reviewed and negative.     Physical Exam/Data:   Vitals:   01/08/21 2031 01/08/21 2330 01/09/21 0517 01/09/21 0812  BP: 111/84 (!) 114/93 109/85 109/77  Pulse: (!) 122 (!) 112 (!) 112 (!) 110  Resp: (!) 22 20 20 18  Temp: 98.2 F (36.8 C) 98.7 F (37.1 C) 98.2 F (36.8 C) (!) 97.4 F (36.3 C)  TempSrc: Oral Oral  Oral  SpO2: 100% 97% 99% 100%  Weight:      Height:        Intake/Output Summary (Last 24 hours) at 01/09/2021 0930 Last data filed at 01/09/2021 0700 Gross per 24 hour  Intake 1760 ml  Output 5525 ml  Net -3765 ml   Last 3 Weights 01/08/2021 01/07/2021 01/07/2021  Weight (lbs) 210 lb 11.2 oz 220 lb 7.4 oz 220 lb 8 oz  Weight (kg) 95.573 kg  100 kg 100.018 kg     Body mass index is 25.65 kg/m.   General:  Well nourished, well developed, in no acute distress HEENT: normal Lymph: no adenopathy Neck: no JVD Endocrine:  No thryomegaly Vascular: No carotid bruits; FA pulses 2+ bilaterally without bruits  Cardiac:  normal S1, S2; regular rhythm, tachycardic; no murmur  Lungs:  clear to auscultation bilaterally, no wheezing, rhonchi or rales  Abd: soft, nontender, no hepatomegaly  Ext: no edema Musculoskeletal:  No deformities, BUE and BLE strength normal and equal Skin: warm and dry  Neuro:  CNs 2-12 intact, no focal abnormalities noted Psych:  Normal   affect   Telemetry personally reviewed and shows atrial flutter with what appears to be 2-1 AV conduction.  Relevant CV Studies:  01/06/2021 ECG from ER   01/06/2021 ECG post cardioversion  Sinus. LAA.   01/06/2021 ECG AF   01/07/2021 ECG AFL   Laboratory Data:  High Sensitivity Troponin:   Recent Labs  Lab 01/06/21 0416 01/06/21 0609 01/06/21 0755 01/06/21 1054 01/06/21 1343  TROPONINIHS 13 55* 146* 244* 268*     Chemistry Recent Labs  Lab 01/06/21 2332 01/08/21 0637 01/09/21 0549  NA 134* 131* 131*  K 4.6 4.2 4.5  CL 101 96* 97*  CO2 25 25 20*  GLUCOSE 238* 223* 197*  BUN 31* 27* 27*  CREATININE 1.35* 1.34* 1.24  CALCIUM 8.5* 9.3 9.3  GFRNONAA >60 >60 >60  ANIONGAP 8 10 14     No results for input(s): PROT, ALBUMIN, AST, ALT, ALKPHOS, BILITOT in the last 168 hours. Hematology Recent Labs  Lab 01/07/21 0330 01/08/21 0637 01/09/21 0549  WBC 4.9 6.7 9.0  RBC 5.19 6.10* 6.02*  HGB 15.4 17.9* 17.8*  HCT 44.9 52.0 51.6  MCV 86.5 85.2 85.7  MCH 29.7 29.3 29.6  MCHC 34.3 34.4 34.5  RDW 13.2 13.4 13.3  PLT 146* 197 216   BNP Recent Labs  Lab 01/06/21 0416  BNP 1,638.8*    DDimer No results for input(s): DDIMER in the last 168 hours.   Radiology/Studies:  CARDIAC CATHETERIZATION  Result Date: 01/07/2021  Severely depressed left  ventricular function ejection fraction of around 25%  Normal coronaries  Conclusion Normal coronaries Severely depressed left ventricular function EF around 25% globally Nonischemic cardiomyopathy   DG Chest Port 1 View  Result Date: 01/06/2021 CLINICAL DATA:  Chest pain, AFib EXAM: PORTABLE CHEST 1 VIEW COMPARISON:  12/23/2020 FINDINGS: Mild right basilar opacity, new, atelectasis versus pneumonia. No pleural effusion or pneumothorax. The heart is top-normal in size. IMPRESSION: Mild right basilar opacity, new, atelectasis versus pneumonia. Electronically Signed   By: 12/25/2020 M.D.   On: 01/06/2021 09:02     Assessment and Plan:   1. Symptomatic atrial fibrillation and flutter Patient appears to have highly symptomatic episodes of atrial fibrillation and flutter.  When he first arrived to the hospital he was in a very rapidly conducting atrial flutter versus SVT.  It is possible that he has an SVT that is triggering episodes of atrial fibrillation/flutter.  Management of his arrhythmias should be geared towards addressing both of these possibilities.  He has been anticoagulated this hospitalization but it was interrupted for a left heart catheterization which did not show significant coronary obstruction.  Given his left ventricular dysfunction, I would favor a rhythm control strategy for Mr. Fleischer.  We discussed the various options including antiarrhythmic therapy and ablation therapy.  His antiarrhythmic therapy options are limited long-term given his chronic kidney disease and left ventricular dysfunction.  He is currently on amiodarone which was started when he first arrived.  I think this is a reasonable strategy in the short-term but is not ideal for long-term rhythm control strategy given his young age.  I would favor transitioning this to an oral regimen after cardioversion.  We will plan to start amiodarone 400 mg by mouth twice daily tonight (January 09, 2021).  He will continue  twice daily dosing for a total of 5 days.  He will then transition to 400 mg by mouth once daily for 5 days.  He would then transition to 200 mg by mouth once  daily.  He will continue on the 200 mg daily dose until I see him in follow-up.  I will plan to see him in follow-up in 6 to 8 weeks to reassess his heart rhythm.  We will also discuss EP study with atrial fibrillation/flutter ablation at that appointment in more detail.  I think this is the best long-term strategy for him which would allow him to avoid long-term exposure to amiodarone.  EP study and ablation will also allow me to address the possibility that there is an SVT triggering episodes of atrial fibrillation/flutter.  I did discuss ablation with the patient during today's visit.  His parents were here for that conversation.  I discussed the risks, expected recovery time and efficacy of ablation compared to antiarrhythmic therapy.  We will plan to cardiovert him today with a TEE to help restore normal rhythm now that he has been loaded with amiodarone.  TEE/cardioversion was discussed in detail with the patient and he is agreeable to proceed.   For questions or updates, please contact [facility] Please consult [email or web address] for contact info under    Signed, Lanier Prude, MD  01/09/2021 9:30 AM

## 2021-01-09 NOTE — Anesthesia Preprocedure Evaluation (Addendum)
Anesthesia Evaluation  Patient identified by MRN, date of birth, ID band Patient awake    Reviewed: Allergy & Precautions, NPO status , Patient's Chart, lab work & pertinent test results  Airway Mallampati: II  TM Distance: >3 FB     Dental  (+) Chipped   Pulmonary pneumonia, Current Smoker,    Pulmonary exam normal        Cardiovascular hypertension, Pt. on medications +CHF  + dysrhythmias Atrial Fibrillation  Rhythm:Irregular Rate:Tachycardia     Neuro/Psych negative neurological ROS  negative psych ROS   GI/Hepatic Neg liver ROS, GERD  ,  Endo/Other  diabetes, Type 2  Renal/GU Renal disease  negative genitourinary   Musculoskeletal negative musculoskeletal ROS (+)   Abdominal Normal abdominal exam  (+)   Peds negative pediatric ROS (+)  Hematology negative hematology ROS (+)   Anesthesia Other Findings Past Medical History: No date: Arrhythmia     Comment:  atrial fibrillation No date: CHF (congestive heart failure) (HCC) No date: Diabetes mellitus without complication (HCC) No date: Hypertension  Reproductive/Obstetrics                            Anesthesia Physical Anesthesia Plan  ASA: III  Anesthesia Plan: General   Post-op Pain Management:    Induction: Intravenous  PONV Risk Score and Plan:   Airway Management Planned: Nasal Cannula  Additional Equipment:   Intra-op Plan:   Post-operative Plan:   Informed Consent: I have reviewed the patients History and Physical, chart, labs and discussed the procedure including the risks, benefits and alternatives for the proposed anesthesia with the patient or authorized representative who has indicated his/her understanding and acceptance.     Dental advisory given  Plan Discussed with: CRNA and Surgeon  Anesthesia Plan Comments:         Anesthesia Quick Evaluation

## 2021-01-09 NOTE — Progress Notes (Signed)
PROGRESS NOTE    George Shaw  WGN:[id number] DOB: 06-15-1971 DOA: 01/06/2021 PCP: Lorenso Quarry, NP   Chief complaint.  Palpitation and chest pain. Brief Narrative:  George Shaw a 50 y.o.malewith medical history significant ofrecent admission due to new A fib with RVR on Eliquis, and sCHF with EF 25-30%, HTN, HLD, DM, GERD, CKD-IIIa, tobacco abuse, who present withpalpitation,chest pain and SOB. Upon arrival in the emergency room, her heart rate was within 120s to 140s, blood pressure 74/64 which improved to 100/70. Patient had heart cath on 3/28, normal coronary arteries. 3/29.  Had persistent atrial fibrillation with rapid ventricular response, resumed home dose of metoprolol at 100 mg twice a day.  Also added diltiazem 60 mg every 6 hours, digoxin at 0.125 mg daily.    Assessment & Plan:   Principal Problem:   Atrial fibrillation with RVR (HCC) Active Problems:   Chronic renal failure, stage 3a (HCC)   Type II diabetes mellitus with renal manifestations (HCC)   HTN (hypertension)   HLD (hyperlipidemia)   GERD (gastroesophageal reflux disease)   Acute on chronic systolic CHF (congestive heart failure) (HCC)   Tobacco abuse   Chest pain   Hyponatremia   Elevated troponin   Hypotension   CAP (community acquired pneumonia)  #1.  Paroxysmal atrial fibrillation with rapid ventricle response./SVT Acute on chronic systolic congestive heart failure. Chest pain with mild elevation troponin. Transient hypotension. Patient was given IV Lasix yesterday, he diuresed more than 13 L of urine, volume status much improved.  Lasix changed to oral. Net IO Since Admission: -13,995.65 mL [01/09/21 1706] Patient has normal coronary arteries per heart cath.  He has nonischemic cardiomyopathy. -Despite amiodarone drip, metoprolol, digoxin patient continued to be in rapid A. fib with a heart rate in 110s at rest and being symptomatic so I have requested EP consultation for further  evaluation.  They are planning TEE and cardioversion today. Continue anticoagulation with Eliquis. Normal TSH and elevated T4. His blood pressure has been running low -will need close monitoring and adjustment in his cardiac medicine as needed -For now continue amiodarone 400 mg p.o. twice daily, digoxin 0.125 mg p.o. daily, Lasix 40 mg p.o. daily starting tomorrow.  Metoprolol 100 mg p.o. twice daily.  Entresto half tablet p.o. twice daily  #2.  Hyponatremia. Chronic kidney disease stage IIIa. Renal function is still stable after diuretics.  3.  Uncontrolled type 2 diabetes with hyperglycemia. Increased dose of Lantus, continue sliding scale insulin.  4. Overweight. BMI 25.65.  5. Possible Right low lobe pneumonia.  CXR showed right low lobe infiltrates, mild elevated procalcitonin.  Continue Augmentin and doxycycline. Recheck procal is improving (0.22 >0.12)    DVT prophylaxis: Eliquis Code Status: Full Family Communication: Parents at bedside. Disposition Plan:  .   Status is: Inpatient  Remains inpatient appropriate because:Inpatient level of care appropriate due to severity of illness   Dispo: The patient is from: Home              Anticipated d/c is to: Home              Patient currently is not medically stable to d/c.   Difficult to place patient No     I/O last 3 completed shifts: In: 2363 [P.O.:2360; I.V.:3] Out: 9925 [Urine:9925] No intake/output data recorded.     Consultants:   Cardiology  Electrophysiology  Procedures: Cardioversion on 3/30  Antimicrobials: Augmentin, doxycycline Subjective: Patient still has some palpitation.  Feels frustrated as wants to go  home but his heart rate is still racing and wants second opinion for cardiology as he is readmitted for the same exact issue.  Parents at bedside   Objective: Vitals:   01/09/21 1605 01/09/21 1615 01/09/21 1630 01/09/21 1702  BP: (!) 85/63 90/68 (!) 86/66 90/66  Pulse: 63 63 65 67   Resp: (!) 25 (!) 21  18  Temp:    98 F (36.7 C)  TempSrc:    Oral  SpO2: 96% 92% 99% 98%  Weight:      Height:        Intake/Output Summary (Last 24 hours) at 01/09/2021 1705 Last data filed at 01/09/2021 0700 Gross per 24 hour  Intake 600 ml  Output 4225 ml  Net -3625 ml   Filed Weights   01/07/21 1305 01/08/21 1027 01/09/21 1100  Weight: 100 kg 95.6 kg 95.3 kg    Examination:  General exam: Appears calm and comfortable  Respiratory system: Clear to auscultation. Respiratory effort normal. Cardiovascular system: Irregular irregular and tachycardiac. No JVD, murmurs, rubs, gallops or clicks. No pedal edema. Gastrointestinal system: Abdomen is nondistended, soft and nontender. No organomegaly or masses felt. Normal bowel sounds heard. Central nervous system: Alert and oriented. No focal neurological deficits. Extremities: Symmetric 5 x 5 power. Skin: No rashes, lesions or ulcers Psychiatry: Judgement and insight appear normal. Mood & affect appropriate.  Seems angry/frustrated    Data Reviewed: I have personally reviewed following labs and imaging studies  CBC: Recent Labs  Lab 01/06/21 0416 01/07/21 0330 01/08/21 0637 01/09/21 0549  WBC 7.7 4.9 6.7 9.0  NEUTROABS 4.8  --  4.5 6.0  HGB 16.9 15.4 17.9* 17.8*  HCT 48.7 44.9 52.0 51.6  MCV 85.3 86.5 85.2 85.7  PLT 156 146* 197 216   Basic Metabolic Panel: Recent Labs  Lab 01/06/21 0416 01/06/21 0755 01/06/21 1518 01/06/21 2332 01/07/21 0330 01/08/21 0637 01/09/21 0549  NA 124* 130* 134* 134*  --  131* 131*  K 4.3 4.1 4.5 4.6  --  4.2 4.5  CL 91* 94* 101 101  --  96* 97*  CO2 20* 27 25 25   --  25 20*  GLUCOSE 259* 248* 142* 238*  --  223* 197*  BUN 25* 25* 24* 31*  --  27* 27*  CREATININE 1.52* 1.40* 1.23 1.35*  --  1.34* 1.24  CALCIUM 9.0 8.3* 8.5* 8.5*  --  9.3 9.3  MG 2.2  --   --   --  2.1 1.9 1.9   GFR: Estimated Creatinine Clearance: 90.4 mL/min (by C-G formula based on SCr of 1.24  mg/dL). Liver Function Tests: No results for input(s): AST, ALT, ALKPHOS, BILITOT, PROT, ALBUMIN in the last 168 hours. No results for input(s): LIPASE, AMYLASE in the last 168 hours. No results for input(s): AMMONIA in the last 168 hours. Coagulation Profile: Recent Labs  Lab 01/06/21 0457  INR 1.4*   Cardiac Enzymes: No results for input(s): CKTOTAL, CKMB, CKMBINDEX, TROPONINI in the last 168 hours. BNP (last 3 results) No results for input(s): PROBNP in the last 8760 hours. HbA1C: Recent Labs    01/07/21 0330  HGBA1C 8.0*   CBG: Recent Labs  Lab 01/08/21 2032 01/09/21 0812 01/09/21 1205 01/09/21 1415 01/09/21 1656  GLUCAP 242* 300* 234* 142* 143*   Lipid Profile: No results for input(s): CHOL, HDL, LDLCALC, TRIG, CHOLHDL, LDLDIRECT in the last 72 hours. Thyroid Function Tests: Recent Labs    01/09/21 0549  TSH 3.187  FREET4 1.19*  Anemia Panel: No results for input(s): VITAMINB12, FOLATE, FERRITIN, TIBC, IRON, RETICCTPCT in the last 72 hours. Sepsis Labs: Recent Labs  Lab 01/07/21 0330 01/09/21 0549  PROCALCITON 0.22 0.12    Recent Results (from the past 240 hour(s))  Resp Panel by RT-PCR (Flu A&B, Covid) Nasopharyngeal Swab     Status: None   Collection Time: 01/06/21  6:46 AM   Specimen: Nasopharyngeal Swab; Nasopharyngeal(NP) swabs in vial transport medium  Result Value Ref Range Status   SARS Coronavirus 2 by RT PCR NEGATIVE NEGATIVE Final    Comment: (NOTE) SARS-CoV-2 target nucleic acids are NOT DETECTED.  The SARS-CoV-2 RNA is generally detectable in upper respiratory specimens during the acute phase of infection. The lowest concentration of SARS-CoV-2 viral copies this assay can detect is 138 copies/mL. A negative result does not preclude SARS-Cov-2 infection and should not be used as the sole basis for treatment or other patient management decisions. A negative result may occur with  improper specimen collection/handling, submission of  specimen other than nasopharyngeal swab, presence of viral mutation(s) within the areas targeted by this assay, and inadequate number of viral copies(<138 copies/mL). A negative result must be combined with clinical observations, patient history, and epidemiological information. The expected result is Negative.  Fact Sheet for Patients:  BloggerCourse.comhttps://www.fda.gov/media/152166/download  Fact Sheet for Healthcare Providers:  SeriousBroker.ithttps://www.fda.gov/media/152162/download  This test is no t yet approved or cleared by the [place] FDA and  has been authorized for detection and/or diagnosis of SARS-CoV-2 by FDA under an Emergency Use Authorization (EUA). This EUA will remain  in effect (meaning this test can be used) for the duration of the COVID-19 declaration under Section 564(b)(1) of the Act, 21 U.S.C.section 360bbb-3(b)(1), unless the authorization is terminated  or revoked sooner.       Influenza A by PCR NEGATIVE NEGATIVE Final   Influenza B by PCR NEGATIVE NEGATIVE Final    Comment: (NOTE) The Xpert Xpress SARS-CoV-2/FLU/RSV plus assay is intended as an aid in the diagnosis of influenza from Nasopharyngeal swab specimens and should not be used as a sole basis for treatment. Nasal washings and aspirates are unacceptable for Xpert Xpress SARS-CoV-2/FLU/RSV testing.  Fact Sheet for Patients: BloggerCourse.comhttps://www.fda.gov/media/152166/download  Fact Sheet for Healthcare Providers: SeriousBroker.ithttps://www.fda.gov/media/152162/download  This test is not yet approved or cleared by the [place] FDA and has been authorized for detection and/or diagnosis of SARS-CoV-2 by FDA under an Emergency Use Authorization (EUA). This EUA will remain in effect (meaning this test can be used) for the duration of the COVID-19 declaration under Section 564(b)(1) of the Act, 21 U.S.C. section 360bbb-3(b)(1), unless the authorization is terminated or revoked.  Performed at [facility], [address] [place], [place] [address]          Radiology Studies: ECHO TEE  Result Date: 01/09/2021    TRANSESOPHOGEAL ECHO REPORT   Patient Name:   Cecil CobbsMATTHEW Mallery Date of Exam: 01/09/2021 Medical Rec #:  [id number]        Height:       76.0 in Accession #:    [id number]       Weight:       210.1 lb Date of Birth:  01/16/1971        BSA:          2.262 m Patient Age:    49 years         BP:           81/62 mmHg Patient Gender: M  HR:           62 bpm. Exam Location:  [facility] Procedure: Transesophageal Echo, Cardiac Doppler, Color Doppler and Saline            Contrast Bubble Study Indications:     Atrial Flutter 427.32 / I48.92  History:         Patient has prior history of Echocardiogram examinations, most                  recent 12/24/2020. Risk Factors:Hypertension and Diabetes.  Sonographer:     Cristela Blue RDCS (AE) Referring Phys:  [id number] MUHAMMAD A ARIDA Diagnosing Phys: [name] MD PROCEDURE: The transesophogeal probe was passed without difficulty through the esophogus of the patient. Imaged were obtained with the patient in a left lateral decubitus position. Local oropharyngeal anesthetic was provided with Benzocaine spray and viscous lidocaine. Sedation performed by different physician. Image quality was excellent. The patient's vital signs; including heart rate, blood pressure, and oxygen saturation; remained stable throughout the procedure. The patient developed no complications during the procedure. A successful direct current cardioversion was performed. IMPRESSIONS  1. Left ventricular ejection fraction, by estimation, is <20%. The left ventricle has severely decreased function. The left ventricle demonstrates global hypokinesis. The left ventricular internal cavity size was moderately dilated. Left ventricular diastolic function could not be evaluated.  2. Right ventricular systolic function is normal. The right ventricular size is normal.  3. Left atrial size was severely dilated. No  left atrial/left atrial appendage thrombus was detected. The LAA emptying velocity was 20 cm/s. Significant smoke with no thrombus.  4. Right atrial size was severely dilated. Significant smoke with no thrombus.  5. The mitral valve is normal in structure. Mild mitral valve regurgitation. No evidence of mitral stenosis.  6. The aortic valve is normal in structure. Aortic valve regurgitation is not visualized. No aortic stenosis is present.  7. The inferior vena cava is normal in size with greater than 50% respiratory variability, suggesting right atrial pressure of 3 mmHg.  8. Agitated saline contrast bubble study was negative, with no evidence of any interatrial shunt. Conclusion(s)/Recommendation(s): No LA/LAA thrombus identified. Successful cardioversion performed with restoration of normal sinus rhythm. FINDINGS  Left Ventricle: Left ventricular ejection fraction, by estimation, is <20%. The left ventricle has severely decreased function. The left ventricle demonstrates global hypokinesis. The left ventricular internal cavity size was moderately dilated. There is no left ventricular hypertrophy. Left ventricular diastolic function could not be evaluated. Right Ventricle: The right ventricular size is normal. No increase in right ventricular wall thickness. Right ventricular systolic function is normal. Left Atrium: Left atrial size was severely dilated. Spontaneous echo contrast was present. No left atrial/left atrial appendage thrombus was detected. The LAA emptying velocity was 20 cm/s. Right Atrium: Right atrial size was severely dilated. Pericardium: There is no evidence of pericardial effusion. Mitral Valve: The mitral valve is normal in structure. Mild mitral valve regurgitation. No evidence of mitral valve stenosis. Tricuspid Valve: The tricuspid valve is normal in structure. Tricuspid valve regurgitation is mild . No evidence of tricuspid stenosis. Aortic Valve: The aortic valve is normal in structure.  Aortic valve regurgitation is not visualized. No aortic stenosis is present. Pulmonic Valve: The pulmonic valve was normal in structure. Pulmonic valve regurgitation is not visualized. No evidence of pulmonic stenosis. Aorta: The aortic root is normal in size and structure. Venous: The inferior vena cava is normal in size with greater than 50% respiratory variability, suggesting right atrial  pressure of 3 mmHg. IAS/Shunts: No atrial level shunt detected by color flow Doppler. Agitated saline contrast was given intravenously to evaluate for intracardiac shunting. Agitated saline contrast bubble study was negative, with no evidence of any interatrial shunt. [name] MD Electronically signed by [name] MD Signature Date/Time: 01/09/2021/4:18:12 PM    Final         Scheduled Meds: . amiodarone  400 mg Oral BID  . amoxicillin-clavulanate  1 tablet Oral Q12H  . apixaban  5 mg Oral BID  . butamben-tetracaine-benzocaine      . digoxin  0.125 mg Oral Daily  . doxycycline  100 mg Oral Q12H  . [START ON 01/10/2021] furosemide  40 mg Oral Daily  . insulin aspart  0-5 Units Subcutaneous QHS  . insulin aspart  0-9 Units Subcutaneous TID WC  . insulin glargine  16 Units Subcutaneous QHS  . lidocaine      . magnesium oxide  400 mg Oral Daily  . metoprolol tartrate  100 mg Oral BID  . nicotine  21 mg Transdermal Daily  . omeprazole  20 mg Oral Daily  . rosuvastatin  10 mg Oral Daily  . sacubitril-valsartan  0.5 tablet Oral BID  . sodium chloride flush  3 mL Intravenous Q12H  . sodium chloride flush       Continuous Infusions: . sodium chloride       LOS: 3 days    Time spent: 35 minutes    Jaxin Fulfer Sherryll Burger, MD [facility]   To contact the attending provider between 7A-7P or the covering provider during after hours 7P-7A, please log into the web site [email or web address] and access using universal [place] password for that web site. If you do not have the password, please call the  hospital operator.  01/09/2021, 5:05 PM

## 2021-01-09 NOTE — Progress Notes (Signed)
*  PRELIMINARY RESULTS* Echocardiogram Echocardiogram Transesophageal has been performed.  George Shaw 01/09/2021, 4:05 PM

## 2021-01-09 NOTE — CV Procedure (Signed)
Cardioversion note: A standard informed consent was obtained. Timeout was performed. The pads were placed in the anterior posterior fashion. The patient was given propofol by the anesthesia team.  A transesophageal echocardiogram was performed and showed no evidence of intracardiac thrombus. Successful cardioversion was performed with a 100 J. The patient converted to sinus rhythm. Pre-and post EKGs were reviewed. The patient tolerated the procedure with no immediate complications.  Recommendations: Start oral amiodarone 400 mg twice daily then discontinue amiodarone drip.

## 2021-01-09 NOTE — Progress Notes (Addendum)
Mobility Specialist - Progress Note   01/09/21 1654  Mobility  Activity Off unit  Mobility performed by Mobility specialist    Pt off unit at time of attempt. Will attempt session at another date/time as pt becomes available.    Filiberto Pinks Mobility Specialist 01/09/21, 4:54 PM

## 2021-01-10 ENCOUNTER — Encounter: Payer: Self-pay | Admitting: Internal Medicine

## 2021-01-10 ENCOUNTER — Telehealth: Payer: Self-pay | Admitting: *Deleted

## 2021-01-10 LAB — CBC
HCT: 47.8 % (ref 39.0–52.0)
Hemoglobin: 16.4 g/dL (ref 13.0–17.0)
MCH: 29.4 pg (ref 26.0–34.0)
MCHC: 34.3 g/dL (ref 30.0–36.0)
MCV: 85.7 fL (ref 80.0–100.0)
Platelets: 191 K/uL (ref 150–400)
RBC: 5.58 MIL/uL (ref 4.22–5.81)
RDW: 13.4 % (ref 11.5–15.5)
WBC: 7.1 K/uL (ref 4.0–10.5)
nRBC: 0 % (ref 0.0–0.2)

## 2021-01-10 LAB — BASIC METABOLIC PANEL WITH GFR
Anion gap: 9 (ref 5–15)
BUN: 27 mg/dL — ABNORMAL HIGH (ref 6–20)
CO2: 25 mmol/L (ref 22–32)
Calcium: 8.9 mg/dL (ref 8.9–10.3)
Chloride: 98 mmol/L (ref 98–111)
Creatinine, Ser: 1.34 mg/dL — ABNORMAL HIGH (ref 0.61–1.24)
GFR, Estimated: >60 mL/min
Glucose, Bld: 225 mg/dL — ABNORMAL HIGH (ref 70–99)
Potassium: 4.3 mmol/L (ref 3.5–5.1)
Sodium: 132 mmol/L — ABNORMAL LOW (ref 135–145)

## 2021-01-10 LAB — GLUCOSE, CAPILLARY
Glucose-Capillary: 198 mg/dL — ABNORMAL HIGH (ref 70–99)
Glucose-Capillary: 285 mg/dL — ABNORMAL HIGH (ref 70–99)

## 2021-01-10 LAB — PROCALCITONIN: Procalcitonin: <0.1 ng/mL

## 2021-01-10 MED ORDER — DIGOXIN 125 MCG PO TABS: 0.1250 mg | ORAL_TABLET | Freq: Every day | ORAL | 0 refills | Status: DC

## 2021-01-10 MED ORDER — INSULIN GLARGINE 100 UNIT/ML SOLOSTAR PEN: 15.0000 [IU] | PEN_INJECTOR | Freq: Every day | SUBCUTANEOUS | 0 refills | Status: DC

## 2021-01-10 MED ORDER — DOXYCYCLINE HYCLATE 100 MG PO TABS: 100.0000 mg | ORAL_TABLET | Freq: Two times a day (BID) | ORAL | 0 refills | Status: AC

## 2021-01-10 MED ORDER — SACUBITRIL-VALSARTAN 24-26 MG PO TABS: 0.5000 | ORAL_TABLET | Freq: Two times a day (BID) | ORAL | 0 refills | Status: DC

## 2021-01-10 MED ORDER — AMOXICILLIN-POT CLAVULANATE 875-125 MG PO TABS: 1.0000 | ORAL_TABLET | Freq: Two times a day (BID) | ORAL | 0 refills | Status: AC

## 2021-01-10 MED ORDER — AMIODARONE HCL 400 MG PO TABS: 400.0000 mg | ORAL_TABLET | Freq: Two times a day (BID) | ORAL | 0 refills | Status: DC

## 2021-01-10 NOTE — Progress Notes (Signed)
Mobility Specialist - Progress Note   01/10/21 1200  Mobility  Activity Ambulated in hall  Level of Assistance Independent  Assistive Device None  Distance Ambulated (ft) 350 ft  Mobility Response Tolerated well  Mobility performed by Mobility specialist  $Mobility charge 1 Mobility    Pre-mobility: 63 HR, 13 RR During mobility: 66 HR, 33 RR Post-mobility: 66 HR, 19 RR   Pt ambulated in hallway without AD. Independent. HR ranging between 63-66 bpm during session. No chest pain/tightness. RR briefly peaked at 43, but maintained mid 30s during ambulation. No SOB, fatigue. Pt reports feeling great!   Filiberto Pinks Mobility Specialist 01/10/21, 12:15 PM

## 2021-01-10 NOTE — Telephone Encounter (Signed)
The patient is still currently admitted as of this morning.  Will review with TCM call on 01/11/21.   To Scheduling to arrange for a follow up appointment with Dr. Lalla Brothers.

## 2021-01-10 NOTE — Progress Notes (Signed)
[facility]  Patient Description: [name] is a 50 year old male with PMH significant for recently diagnosed nonischemic cardiomyopathy with a EF estimated at 25 to 30%, hypertension, hyperlipidemia, DM type II and CKD stage III OA who was admitted with a new onset of atrial fibrillation with RVR.   Significant cardiovascular procedures: 01/07/2021: The patient underwent left heart catheterization with coronary angiography which revealed normal coronary anatomy and severely depressed LV function with estimated EF of 25% globally.  01/10/21: The patient underwent echocardiogram TEE which revealed no evidence of atrial thrombus and subsequently underwent successful direct-current cardioversion with successful conversion back to normal sinus rhythm.  SUBJECTIVE: The patient reports to be doing well on today and he denies having any chest pain, palpitations, syncope or dizziness at this time. He reports that he is ready for discharge.   OBJECTIVE: The patient is sitting comfortably at the bedside and his parents are also at the bedside with him.  The patient continues to be in normal sinus rhythm with a heart rate of 63 bpm per the bedside telemetry monitoring.  The patient's vital signs are stable and he is in no apparent distress.   Vitals:   01/09/21 2351 01/10/21 0411 01/10/21 0756 01/10/21 1131  BP: 91/73 105/75 114/70 100/65  Pulse: 70 67 68 63  Resp: 18 17 16    Temp: 98.2 F (36.8 C) 98.7 F (37.1 C) 97.8 F (36.6 C) 97.6 F (36.4 C)  TempSrc: Oral Oral Oral Oral  SpO2: 98% 100% 99% 100%  Weight:      Height:         Intake/Output Summary (Last 24 hours) at 01/10/2021 1225 Last data filed at 01/10/2021 1000 Gross per 24 hour  Intake 1265.88 ml  Output 2200 ml  Net -934.12 ml      PHYSICAL EXAM  General: Well developed, well nourished, in no acute distress HEENT:  Normocephalic and atramatic Neck:  No JVD.  Lungs: Clear bilaterally to auscultation and  percussion. Heart: HRRR . Normal S1 and S2 without gallops or murmurs.  Abdomen: Bowel sounds are positive, abdomen soft and non-tender  Msk:  Back normal, normal gait. Normal strength and tone for age. Extremities: No clubbing, cyanosis or edema.   Neuro: Alert and oriented X 3. Psych:  Good affect, responds appropriately Skin: radial procedure site is clean, dry and intact; nonocclusive dressing remains in place, areas is without hematoma or erythema   LABS: Basic Metabolic Panel: Recent Labs    01/08/21 0637 01/09/21 0549 01/10/21 0409  NA 131* 131* 132*  K 4.2 4.5 4.3  CL 96* 97* 98  CO2 25 20* 25  GLUCOSE 223* 197* 225*  BUN 27* 27* 27*  CREATININE 1.34* 1.24 1.34*  CALCIUM 9.3 9.3 8.9  MG 1.9 1.9  --    Liver Function Tests: No results for input(s): AST, ALT, ALKPHOS, BILITOT, PROT, ALBUMIN in the last 72 hours. No results for input(s): LIPASE, AMYLASE in the last 72 hours. CBC: Recent Labs    01/08/21 0637 01/09/21 0549 01/10/21 0409  WBC 6.7 9.0 7.1  NEUTROABS 4.5 6.0  --   HGB 17.9* 17.8* 16.4  HCT 52.0 51.6 47.8  MCV 85.2 85.7 85.7  PLT 197 216 191   Cardiac Enzymes: No results for input(s): CKTOTAL, CKMB, CKMBINDEX, TROPONINI in the last 72 hours. BNP: Invalid input(s): POCBNP D-Dimer: No results for input(s): DDIMER in the last 72 hours. Hemoglobin A1C: No results for input(s): HGBA1C in the last 72 hours. Fasting Lipid Panel:  No results for input(s): CHOL, HDL, LDLCALC, TRIG, CHOLHDL, LDLDIRECT in the last 72 hours. Thyroid Function Tests: Recent Labs    01/09/21 0549  TSH 3.187   Anemia Panel: No results for input(s): VITAMINB12, FOLATE, FERRITIN, TIBC, IRON, RETICCTPCT in the last 72 hours.  ECHO TEE  Result Date: 01/09/2021    TRANSESOPHOGEAL ECHO REPORT   Patient Name:   George Shaw Date of Exam: 01/09/2021 Medical Rec #:  [id number]        Height:       76.0 in Accession #:    [id number]       Weight:       210.1 lb Date of Birth:   02-Feb-1971        BSA:          2.262 m Patient Age:    49 years         BP:           81/62 mmHg Patient Gender: M                HR:           62 bpm. Exam Location:  [facility] Procedure: Transesophageal Echo, Cardiac Doppler, Color Doppler and Saline            Contrast Bubble Study Indications:     Atrial Flutter 427.32 / I48.92  History:         Patient has prior history of Echocardiogram examinations, most                  recent 12/24/2020. Risk Factors:Hypertension and Diabetes.  Sonographer:     Cristela Blue RDCS (AE) Referring Phys:  [id number] MUHAMMAD A ARIDA Diagnosing Phys: [name] MD PROCEDURE: The transesophogeal probe was passed without difficulty through the esophogus of the patient. Imaged were obtained with the patient in a left lateral decubitus position. Local oropharyngeal anesthetic was provided with Benzocaine spray and viscous lidocaine. Sedation performed by different physician. Image quality was excellent. The patient's vital signs; including heart rate, blood pressure, and oxygen saturation; remained stable throughout the procedure. The patient developed no complications during the procedure. A successful direct current cardioversion was performed. IMPRESSIONS  1. Left ventricular ejection fraction, by estimation, is <20%. The left ventricle has severely decreased function. The left ventricle demonstrates global hypokinesis. The left ventricular internal cavity size was moderately dilated. Left ventricular diastolic function could not be evaluated.  2. Right ventricular systolic function is normal. The right ventricular size is normal.  3. Left atrial size was severely dilated. No left atrial/left atrial appendage thrombus was detected. The LAA emptying velocity was 20 cm/s. Significant smoke with no thrombus.  4. Right atrial size was severely dilated. Significant smoke with no thrombus.  5. The mitral valve is normal in structure. Mild mitral valve regurgitation. No evidence of mitral  stenosis.  6. The aortic valve is normal in structure. Aortic valve regurgitation is not visualized. No aortic stenosis is present.  7. The inferior vena cava is normal in size with greater than 50% respiratory variability, suggesting right atrial pressure of 3 mmHg.  8. Agitated saline contrast bubble study was negative, with no evidence of any interatrial shunt. Conclusion(s)/Recommendation(s): No LA/LAA thrombus identified. Successful cardioversion performed with restoration of normal sinus rhythm. FINDINGS  Left Ventricle: Left ventricular ejection fraction, by estimation, is <20%. The left ventricle has severely decreased function. The left ventricle demonstrates global hypokinesis. The left ventricular internal cavity size was moderately dilated. There is  no left ventricular hypertrophy. Left ventricular diastolic function could not be evaluated. Right Ventricle: The right ventricular size is normal. No increase in right ventricular wall thickness. Right ventricular systolic function is normal. Left Atrium: Left atrial size was severely dilated. Spontaneous echo contrast was present. No left atrial/left atrial appendage thrombus was detected. The LAA emptying velocity was 20 cm/s. Right Atrium: Right atrial size was severely dilated. Pericardium: There is no evidence of pericardial effusion. Mitral Valve: The mitral valve is normal in structure. Mild mitral valve regurgitation. No evidence of mitral valve stenosis. Tricuspid Valve: The tricuspid valve is normal in structure. Tricuspid valve regurgitation is mild . No evidence of tricuspid stenosis. Aortic Valve: The aortic valve is normal in structure. Aortic valve regurgitation is not visualized. No aortic stenosis is present. Pulmonic Valve: The pulmonic valve was normal in structure. Pulmonic valve regurgitation is not visualized. No evidence of pulmonic stenosis. Aorta: The aortic root is normal in size and structure. Venous: The inferior vena cava is  normal in size with greater than 50% respiratory variability, suggesting right atrial pressure of 3 mmHg. IAS/Shunts: No atrial level shunt detected by color flow Doppler. Agitated saline contrast was given intravenously to evaluate for intracardiac shunting. Agitated saline contrast bubble study was negative, with no evidence of any interatrial shunt. [name] MD Electronically signed by [name] MD Signature Date/Time: 01/09/2021/4:18:12 PM    Final       TELEMETRY: Normal sinus rhythm with a heart rate of 63 bpm  ASSESSMENT AND PLAN:  Principal Problem:   Atrial fibrillation with RVR (HCC) Active Problems:   Chronic renal failure, stage 3a (HCC)   Type II diabetes mellitus with renal manifestations (HCC)   HTN (hypertension)   HLD (hyperlipidemia)   GERD (gastroesophageal reflux disease)   Acute on chronic systolic CHF (congestive heart failure) (HCC)   Tobacco abuse   Chest pain   Hyponatremia   Elevated troponin   Hypotension   CAP (community acquired pneumonia)    1.  Atrial fibrillation with RVR s/p successful cardioversion with conversion back to normal sinus rhythm  -Continue oral amiodarone therapy per EP recommendations and the patient should follow-up with Dr. Lalla Brothers in 6-8 weeks as recommended for possible EP studies/ablation.  -Continue anticoagulation with Eliquis.  Follow bleeding precautions.  -Continue metoprolol and digoxin therapy.  --The patient is cleared for discharge from a cardiology perspective and should follow up with electrophysiology in 6 to 8 weeks and follow-up with our office with Dr. Darrold Junker in 1- 2 weeks.   2.  Nonischemic cardiomyopathy, confirmed by recent cardiac catheterization which revealed normal coronary anatomy, elevated troponin likely due to demand ischemia  -We will continue Entresto, metoprolol and digoxin therapy.  -Continue Lasix therapy for diuresis.  -Continue daily weights, following a low-sodium heart healthy diet  and monitoring vital signs.  3.  Hypertension, patient is normotensive  -We will continue management with Entresto and metoprolol.  4.  Hyperlipidemia  -Continue rosuvastatin therapy  5.  CKD stage IIIa  -Agree with current management.  6.  Diabetes mellitus type 2  -Agree with current management and sliding scale insulin per protocol.  7.  Tobacco use  -Recommend tobacco cessation.  --The patient is cleared for discharge from a cardiology perspective and should follow up with electrophysiology in 6 to 8 weeks and follow-up with our office with Dr. Darrold Junker in 1- 2 weeks.    Kathern Lobosco, ACNPC-AG  01/10/2021 12:25 PM

## 2021-01-10 NOTE — Discharge Instructions (Signed)

## 2021-01-10 NOTE — Progress Notes (Signed)
Inpatient Diabetes Program Recommendations  AACE/ADA: New Consensus Statement on Inpatient Glycemic Control (2015)  Target Ranges:  Prepandial:   less than 140 mg/dL      Peak postprandial:   less than 180 mg/dL (1-2 hours)      Critically ill patients:  140 - 180 mg/dL   Lab Results  Component Value Date   GLUCAP 285 (H) 01/10/2021   HGBA1C 8.0 (H) 01/07/2021    Review of Glycemic Control  Diabetes history: DM2 Outpatient Diabetes medications: Lantus 10 + Met 500 qd + Januvia 100 Current orders for Inpatient glycemic control: Lantus 16 + Novolog 0-9 units tid + hs 0-5 units  Inpatient Diabetes Program Recommendations:   Received consult regarding diabetes education with pt. Spoke with pt about  A1C results 8.0 (average blood glucose 183 over the past 2-3 months) and explained what an A1C is, basic pathophysiology of DM Type 2, basic home care, basic diabetes diet nutrition principles, importance of checking CBGs and maintaining good CBG control to prevent long-term and short-term complications. Reviewed signs and symptoms of hyperglycemia and hypoglycemia and how to treat hypoglycemia at home. Also reviewed blood sugar goals at home.  Patient states he avoids sweetened drinks, food with sugar and watches carbohydrates. His previous A1c @ the [facility] office was 7.3 2 weeks ago prior to current situation with heart rate. Patient has followup with his cardiologist and plans to start walking and exercising according to his doctor's recommendations.  Thank you, Nani Gasser. Liller Yohn, RN, MSN, CDE  Diabetes Coordinator Inpatient Glycemic Control Team Team Pager [phone] (8am-5pm) 01/10/2021 1:16 PM

## 2021-01-10 NOTE — Telephone Encounter (Signed)
-----   Message -----  From: Loman Chroman  Sent: 01/10/2021  9:43 AM EDT  To: [facility] Scheduling  Subject: FW: EP Office Follow-up              LVM for patient to schedule fu

## 2021-01-10 NOTE — Telephone Encounter (Signed)
-----   Message from Lennon Alstrom, PA-C sent at 01/09/2021  5:20 PM EDT ----- Regarding: EP Office Follow-up Hi there,  This patient is currently admitted to [facility] and was seen by Dr. Lalla Brothers with TEE/DCCV performed for atrial flutter.   (1) Please schedule follow-up with Dr. Lalla Brothers in the [place] office 6-8 weeks from now.  (2) Please confirm he is aware of how to take his oral amiodarone, as it is my experience that pts are not always discharged on the appropriate doses / with appropriate instructions.   Amiodarone schedule is as follows: --Amiodarone 400mg  BID x5 days (3/30-4/4) --Amiodarone 400mg  daily x5 days (4/5-4/10) --Amiodarone 200mg  daily thereafter until seen at follow-up by Dr. .  Thank you so much!  JV

## 2021-01-10 NOTE — Discharge Summary (Signed)
Physician Discharge Summary  George Shaw Morning WUJ:[id number] DOB: 05/31/1971 DOA: 01/06/2021  PCP: George Shaw, Shannon, NP  Admit date: 01/06/2021 Discharge date: 01/10/2021  Discharge disposition: Home   Recommendations for Outpatient Follow-Up:   Follow-up with cardiologist in 1 week   Discharge Diagnosis:   Principal Problem:   Atrial fibrillation with RVR (HCC) Active Problems:   Chronic renal failure, stage 3a (HCC)   Type II diabetes mellitus with renal manifestations (HCC)   HTN (hypertension)   HLD (hyperlipidemia)   GERD (gastroesophageal reflux disease)   Acute on chronic systolic CHF (congestive heart failure) (HCC)   Tobacco abuse   Chest pain   Hyponatremia   Elevated troponin   Hypotension   CAP (community acquired pneumonia)    Discharge Condition: Stable.  Diet recommendation:  Diet Order            Diet - low sodium heart healthy           Diet Carb Modified           Diet heart healthy/carb modified Room service appropriate? Yes; Fluid consistency: Thin  Diet effective now                   Code Status: Full Code     Hospital Course:   [name] is a 50 year old man with medical history significant for new A. fib with RVR and Eliquis (recently diagnosed), chronic systolic CHF with EF of 25 to 95%30%, nonischemic cardiomyopathy, hypertension, hyperlipidemia, diabetes mellitus, GERD, CKD stage IIIa, tobacco use disorder.  He presented to the hospital with chest pain and shortness of breath.   He was found to have A. fib with RVR and hypotension.  Cardiologist was consulted to assist with management.  He underwent TEE which did not show any evidence of cardiac thrombus.  Subsequently, he underwent successful DC cardioversion and he converted back to normal sinus rhythm.  He also had hyperglycemia from uncontrolled diabetes mellitus.  He was treated with insulin. He will continue with Lantus at discharge.  He received education regarding  management of diabetes and treatment with insulin.  He was also educated on clinical features of hypoglycemia and hyperglycemia.  All his symptoms have resolved and he is given stable for discharge to home.  Dr. Darrold JunkerParaschos was notified via secure chat about discharge plan.  From his standpoint, patient is okay for discharge.  Discharge plan was discussed with the patient and his parents at the bedside.    Medical Consultants:    Cardiologist   Discharge Exam:    Vitals:   01/09/21 2351 01/10/21 0411 01/10/21 0756 01/10/21 1131  BP: 91/73 105/75 114/70 100/65  Pulse: 70 67 68 63  Resp: 18 17 16    Temp: 98.2 F (36.8 C) 98.7 F (37.1 C) 97.8 F (36.6 C) 97.6 F (36.4 C)  TempSrc: Oral Oral Oral Oral  SpO2: 98% 100% 99% 100%  Weight:      Height:         GEN: NAD SKIN: No rash EYES: EOMI ENT: MMM CV: RRR PULM: CTA B ABD: soft, ND, NT, +BS CNS: AAO x 3, non focal EXT: No edema or tenderness   The results of significant diagnostics from this hospitalization (including imaging, microbiology, ancillary and laboratory) are listed below for reference.     Procedures and Diagnostic Studies:   ECHO TEE  Result Date: 01/09/2021    TRANSESOPHOGEAL ECHO REPORT   Patient Name:   George Shaw George Shaw Date of Exam: 01/09/2021  Medical Rec #:  [id number]        Height:       76.0 in Accession #:    [id number]       Weight:       210.1 lb Date of Birth:  07-21-1971        BSA:          2.262 m Patient Age:    49 years         BP:           81/62 mmHg Patient Gender: M                HR:           62 bpm. Exam Location:  [facility] Procedure: Transesophageal Echo, Cardiac Doppler, Color Doppler and Saline            Contrast Bubble Study Indications:     Atrial Flutter 427.32 / I48.92  History:         Patient has prior history of Echocardiogram examinations, most                  recent 12/24/2020. Risk Factors:Hypertension and Diabetes.  Sonographer:     George Shaw RDCS (AE) Referring Phys:  [id number]  George Shaw Diagnosing Phys: [name] MD PROCEDURE: The transesophogeal probe was passed without difficulty through the esophogus of the patient. Imaged were obtained with the patient in a left lateral decubitus position. Local oropharyngeal anesthetic was provided with Benzocaine spray and viscous lidocaine. Sedation performed by different physician. Image quality was excellent. The patient's vital signs; including heart rate, blood pressure, and oxygen saturation; remained stable throughout the procedure. The patient developed no complications during the procedure. A successful direct current cardioversion was performed. IMPRESSIONS  1. Left ventricular ejection fraction, by estimation, is <20%. The left ventricle has severely decreased function. The left ventricle demonstrates global hypokinesis. The left ventricular internal cavity size was moderately dilated. Left ventricular diastolic function could not be evaluated.  2. Right ventricular systolic function is normal. The right ventricular size is normal.  3. Left atrial size was severely dilated. No left atrial/left atrial appendage thrombus was detected. The LAA emptying velocity was 20 cm/s. Significant smoke with no thrombus.  4. Right atrial size was severely dilated. Significant smoke with no thrombus.  5. The mitral valve is normal in structure. Mild mitral valve regurgitation. No evidence of mitral stenosis.  6. The aortic valve is normal in structure. Aortic valve regurgitation is not visualized. No aortic stenosis is present.  7. The inferior vena cava is normal in size with greater than 50% respiratory variability, suggesting right atrial pressure of 3 mmHg.  8. Agitated saline contrast bubble study was negative, with no evidence of any interatrial shunt. Conclusion(s)/Recommendation(s): No LA/LAA thrombus identified. Successful cardioversion performed with restoration of normal sinus rhythm. FINDINGS  Left Ventricle: Left ventricular  ejection fraction, by estimation, is <20%. The left ventricle has severely decreased function. The left ventricle demonstrates global hypokinesis. The left ventricular internal cavity size was moderately dilated. There is no left ventricular hypertrophy. Left ventricular diastolic function could not be evaluated. Right Ventricle: The right ventricular size is normal. No increase in right ventricular wall thickness. Right ventricular systolic function is normal. Left Atrium: Left atrial size was severely dilated. Spontaneous echo contrast was present. No left atrial/left atrial appendage thrombus was detected. The LAA emptying velocity was 20 cm/s. Right Atrium: Right atrial size was severely dilated. Pericardium: There is  no evidence of pericardial effusion. Mitral Valve: The mitral valve is normal in structure. Mild mitral valve regurgitation. No evidence of mitral valve stenosis. Tricuspid Valve: The tricuspid valve is normal in structure. Tricuspid valve regurgitation is mild . No evidence of tricuspid stenosis. Aortic Valve: The aortic valve is normal in structure. Aortic valve regurgitation is not visualized. No aortic stenosis is present. Pulmonic Valve: The pulmonic valve was normal in structure. Pulmonic valve regurgitation is not visualized. No evidence of pulmonic stenosis. Aorta: The aortic root is normal in size and structure. Venous: The inferior vena cava is normal in size with greater than 50% respiratory variability, suggesting right atrial pressure of 3 mmHg. IAS/Shunts: No atrial level shunt detected by color flow Doppler. Agitated saline contrast was given intravenously to evaluate for intracardiac shunting. Agitated saline contrast bubble study was negative, with no evidence of any interatrial shunt. [name] MD Electronically signed by [name] MD Signature Date/Time: 01/09/2021/4:18:12 PM    Final      Labs:   Basic Metabolic Panel: Recent Labs  Lab 01/06/21 9381  01/06/21 8299 01/06/21 1518 01/06/21 2332 01/07/21 0330 01/08/21 3716 01/09/21 0549 01/10/21 0409  NA 124*   < > 134* 134*  --  131* 131* 132*  K 4.3   < > 4.5 4.6  --  4.2 4.5 4.3  CL 91*   < > 101 101  --  96* 97* 98  CO2 20*   < > 25 25  --  25 20* 25  GLUCOSE 259*   < > 142* 238*  --  223* 197* 225*  BUN 25*   < > 24* 31*  --  27* 27* 27*  CREATININE 1.52*   < > 1.23 1.35*  --  1.34* 1.24 1.34*  CALCIUM 9.0   < > 8.5* 8.5*  --  9.3 9.3 8.9  MG 2.2  --   --   --  2.1 1.9 1.9  --    < > = values in this interval not displayed.   GFR Estimated Creatinine Clearance: 83.7 mL/min (A) (by C-G formula based on SCr of 1.34 mg/dL (H)). Liver Function Tests: No results for input(s): AST, ALT, ALKPHOS, BILITOT, PROT, ALBUMIN in the last 168 hours. No results for input(s): LIPASE, AMYLASE in the last 168 hours. No results for input(s): AMMONIA in the last 168 hours. Coagulation profile Recent Labs  Lab 01/06/21 0457 01/09/21 1724  INR 1.4* 1.1    CBC: Recent Labs  Lab 01/06/21 0416 01/07/21 0330 01/08/21 0637 01/09/21 0549 01/10/21 0409  WBC 7.7 4.9 6.7 9.0 7.1  NEUTROABS 4.8  --  4.5 6.0  --   HGB 16.9 15.4 17.9* 17.8* 16.4  HCT 48.7 44.9 52.0 51.6 47.8  MCV 85.3 86.5 85.2 85.7 85.7  PLT 156 146* 197 216 191   Cardiac Enzymes: No results for input(s): CKTOTAL, CKMB, CKMBINDEX, TROPONINI in the last 168 hours. BNP: Invalid input(s): POCBNP CBG: Recent Labs  Lab 01/09/21 1415 01/09/21 1656 01/09/21 2115 01/10/21 0758 01/10/21 1131  GLUCAP 142* 143* 343* 198* 285*   D-Dimer No results for input(s): DDIMER in the last 72 hours. Hgb A1c No results for input(s): HGBA1C in the last 72 hours. Lipid Profile No results for input(s): CHOL, HDL, LDLCALC, TRIG, CHOLHDL, LDLDIRECT in the last 72 hours. Thyroid function studies Recent Labs    01/09/21 0549  TSH 3.187   Anemia work up No results for input(s): VITAMINB12, FOLATE, FERRITIN, TIBC, IRON, RETICCTPCT in  the last 72 hours. Microbiology Recent Results (from the past 240 hour(s))  Resp Panel by RT-PCR (Flu A&B, Covid) Nasopharyngeal Swab     Status: None   Collection Time: 01/06/21  6:46 AM   Specimen: Nasopharyngeal Swab; Nasopharyngeal(NP) swabs in vial transport medium  Result Value Ref Range Status   SARS Coronavirus 2 by RT PCR NEGATIVE NEGATIVE Final    Comment: (NOTE) SARS-CoV-2 target nucleic acids are NOT DETECTED.  The SARS-CoV-2 RNA is generally detectable in upper respiratory specimens during the acute phase of infection. The lowest concentration of SARS-CoV-2 viral copies this assay can detect is 138 copies/mL. A negative result does not preclude SARS-Cov-2 infection and should not be used as the sole basis for treatment or other patient management decisions. A negative result may occur with  improper specimen collection/handling, submission of specimen other than nasopharyngeal swab, presence of viral mutation(s) within the areas targeted by this assay, and inadequate number of viral copies(<138 copies/mL). A negative result must be combined with clinical observations, patient history, and epidemiological information. The expected result is Negative.  Fact Sheet for Patients:  BloggerCourse.com  Fact Sheet for Healthcare Providers:  SeriousBroker.it  This test is no t yet approved or cleared by the [place] FDA and  has been authorized for detection and/or diagnosis of SARS-CoV-2 by FDA under an Emergency Use Authorization (EUA). This EUA will remain  in effect (meaning this test can be used) for the duration of the COVID-19 declaration under Section 564(b)(1) of the Act, 21 U.S.C.section 360bbb-3(b)(1), unless the authorization is terminated  or revoked sooner.       Influenza A by PCR NEGATIVE NEGATIVE Final   Influenza B by PCR NEGATIVE NEGATIVE Final    Comment: (NOTE) The Xpert Xpress SARS-CoV-2/FLU/RSV  plus assay is intended as an aid in the diagnosis of influenza from Nasopharyngeal swab specimens and should not be used as a sole basis for treatment. Nasal washings and aspirates are unacceptable for Xpert Xpress SARS-CoV-2/FLU/RSV testing.  Fact Sheet for Patients: BloggerCourse.com  Fact Sheet for Healthcare Providers: SeriousBroker.it  This test is not yet approved or cleared by the [place] FDA and has been authorized for detection and/or diagnosis of SARS-CoV-2 by FDA under an Emergency Use Authorization (EUA). This EUA will remain in effect (meaning this test can be used) for the duration of the COVID-19 declaration under Section 564(b)(1) of the Act, 21 U.S.C. section 360bbb-3(b)(1), unless the authorization is terminated or revoked.  Performed at [facility], [address] [place], [place] [address]      Discharge Instructions:   Discharge Instructions    Diet - low sodium heart healthy   Complete by: As directed    Diet Carb Modified   Complete by: As directed    Increase activity slowly   Complete by: As directed      Allergies as of 01/10/2021   No Known Allergies     Medication List    STOP taking these medications   azithromycin 250 MG tablet Commonly known as: ZITHROMAX   diltiazem 360 MG 24 hr capsule Commonly known as: CARDIZEM CD   Januvia 100 MG tablet Generic drug: sitaGLIPtin   metFORMIN 500 MG 24 hr tablet Commonly known as: GLUCOPHAGE-XR     TAKE these medications   amiodarone 400 MG tablet Commonly known as: PACERONE Take 1 tablet (400 mg total) by mouth 2 (two) times daily for 14 days.   amoxicillin-clavulanate 875-125 MG tablet Commonly known as: AUGMENTIN Take  1 tablet by mouth every 12 (twelve) hours for 2 days.   apixaban 5 MG Tabs tablet Commonly known as: ELIQUIS Take 1 tablet (5 mg total) by mouth 2 (two) times daily.   digoxin 0.125 MG  tablet Commonly known as: LANOXIN Take 1 tablet (0.125 mg total) by mouth daily. Start taking on: January 11, 2021   doxycycline 100 MG tablet Commonly known as: VIBRA-TABS Take 1 tablet (100 mg total) by mouth every 12 (twelve) hours for 2 days.   furosemide 20 MG tablet Commonly known as: Lasix Take 1 tablet (20 mg total) by mouth daily.   insulin glargine 100 UNIT/ML Solostar Pen Commonly known as: LANTUS Inject 15 Units into the skin at bedtime. What changed:   how much to take  when to take this   magnesium oxide 400 MG tablet Commonly known as: MAG-OX Take 1 tablet by mouth daily.   metoprolol tartrate 100 MG tablet Commonly known as: LOPRESSOR Take 1 tablet (100 mg total) by mouth 2 (two) times daily. What changed: how much to take   omeprazole 20 MG tablet Commonly known as: PRILOSEC OTC Take 20 mg by mouth daily.   rosuvastatin 10 MG tablet Commonly known as: CRESTOR Take 10 mg by mouth daily.   sacubitril-valsartan 24-26 MG Commonly known as: ENTRESTO Take 0.5 tablets by mouth 2 (two) times daily.       Follow-up Information    Paraschos, Alexander, MD. Schedule an appointment as soon as possible for a visit on 01/22/2021.   Specialty: Cardiology Why: @ 10:15am Contact information: [address] [facility] [place] [address] [phone]                Time coordinating discharge: 32 minutes  Signed:  Lurene Shadow  [facility] 01/10/2021, 1:24 PM   Pager on [email or web address]. If 7PM-7AM, please contact night-coverage at [email or web address]

## 2021-01-10 NOTE — Anesthesia Postprocedure Evaluation (Signed)
Anesthesia Post Note  Patient: Evann Erazo  Procedure(s) Performed: TRANSESOPHAGEAL ECHOCARDIOGRAM (TEE) (N/A ) CARDIOVERSION (N/A )  Patient location during evaluation: Specials Recovery Anesthesia Type: General Level of consciousness: awake and alert Pain management: pain level controlled Vital Signs Assessment: post-procedure vital signs reviewed and stable Respiratory status: spontaneous breathing, nonlabored ventilation, respiratory function stable and patient connected to nasal cannula oxygen Cardiovascular status: blood pressure returned to baseline and stable Postop Assessment: no apparent nausea or vomiting Anesthetic complications: no   No complications documented.   Last Vitals:  Vitals:   01/09/21 2351 01/10/21 0411  BP: 91/73 105/75  Pulse: 70 67  Resp: 18 17  Temp: 36.8 C 37.1 C  SpO2: 98% 100%    Last Pain:  Vitals:   01/10/21 0411  TempSrc: Oral  PainSc:                  Lenard Simmer

## 2021-01-10 NOTE — Telephone Encounter (Signed)
Bumgarner, Darnelle Going, Jacquelyn D, PA-C; P Cv Div [facility]url Triage Patient called back stating he is going to fu with Dr. Darrold Junker office

## 2021-01-11 NOTE — Telephone Encounter (Signed)
Message received from [place], [place]: "I have relayed this information to Dr. Lalla Brothers."

## 2021-01-21 ENCOUNTER — Ambulatory Visit: Payer: BC Managed Care – PPO | Admitting: Family

## 2021-02-20 ENCOUNTER — Ambulatory Visit: Payer: BC Managed Care – PPO | Admitting: Cardiology

## 2021-03-13 ENCOUNTER — Other Ambulatory Visit: Payer: Self-pay

## 2021-03-13 ENCOUNTER — Encounter: Payer: Self-pay | Admitting: Cardiology

## 2021-03-13 ENCOUNTER — Ambulatory Visit (INDEPENDENT_AMBULATORY_CARE_PROVIDER_SITE_OTHER): Payer: BC Managed Care – PPO | Admitting: Cardiology

## 2021-03-13 VITALS — BP 122/88 | HR 71 | Ht 76.0 in | Wt 229.0 lb

## 2021-03-13 DIAGNOSIS — I4819 Other persistent atrial fibrillation: Secondary | ICD-10-CM

## 2021-03-13 DIAGNOSIS — I10 Essential (primary) hypertension: Secondary | ICD-10-CM | POA: Diagnosis not present

## 2021-03-13 DIAGNOSIS — I5022 Chronic systolic (congestive) heart failure: Secondary | ICD-10-CM | POA: Diagnosis not present

## 2021-03-13 NOTE — Progress Notes (Signed)
Electrophysiology Office Note:    Date:  03/13/2021   ID:  George Shaw, DOB 07/13/1971, MRN [id number]  PCP:  Lorenso Quarry, NP   [facility] Electrophysiologist:  Lanier Prude, MD   Referring MD: Lorenso Quarry, NP   Chief Complaint: Atrial fibrillation, nonischemic cardiomyopathy  History of Present Illness:    George Shaw is a 50 y.o. male who presents for an evaluation of atrial fibrillation and nonischemic cardiomyopathy at the request of Dr. Darrold Junker. Their medical history includes diabetes, hypertension, nonischemic cardiomyopathy and atrial fibrillation with rapid ventricular response.  Last saw the patient when he was hospitalized for acute systolic heart failure on January 09, 2021.  He was admitted with RVR and ventricular rates greater than 200 bpm.  He was hypotensive and required emergent cardioversion.  He was diuresed over 10 L during the hospitalization with improvement of his symptoms.  In the setting of A. fib with RVR, his ventricular function was severely decreased at less than 20%.  He was started on amiodarone and is now taking 200 mg by mouth daily.  Thankfully this is helped to maintain normal rhythm.  Since leaving the hospital, the patient is doing significantly better.  He did have to increase his Lasix dose to maintain euvolemia.  He is doing well on his anticoagulant and is now taking Xarelto 20 mg by mouth once daily.  He has had no trouble with bleeding.  He is interested in coming off the amiodarone given his potential for off target effects.  He is with his significant other in clinic today.  Past Medical History:  Diagnosis Date  . Arrhythmia    atrial fibrillation  . CHF (congestive heart failure) (HCC)   . Diabetes mellitus without complication (HCC)   . Hypertension     Past Surgical History:  Procedure Laterality Date  . CARDIOVERSION N/A 01/09/2021   Procedure: CARDIOVERSION;  Surgeon: Iran Ouch, MD;  Location: [facility] ORS;   Service: Cardiovascular;  Laterality: N/A;  . LEFT HEART CATH AND CORONARY ANGIOGRAPHY N/A 01/07/2021   Procedure: LEFT HEART CATH AND CORONARY ANGIOGRAPHY with possible PCI and stent;  Surgeon: Alwyn Pea, MD;  Location: [facility] INVASIVE CV LAB;  Service: Cardiovascular;  Laterality: N/A;  . TEE WITHOUT CARDIOVERSION N/A 01/09/2021   Procedure: TRANSESOPHAGEAL ECHOCARDIOGRAM (TEE);  Surgeon: Iran Ouch, MD;  Location: [facility] ORS;  Service: Cardiovascular;  Laterality: N/A;  Dr. Steffanie Dunn procedure (Epic won't let me assign him for TEE/DCCV)    Current Medications: Current Meds  Medication Sig  . amiodarone (PACERONE) 200 MG tablet Take 200 mg by mouth daily.  . furosemide (LASIX) 20 MG tablet Take 1 tablet (20 mg total) by mouth daily.  . magnesium oxide (MAG-OX) 400 MG tablet Take 1 tablet by mouth daily.  . metoprolol tartrate (LOPRESSOR) 100 MG tablet Take 1 tablet (100 mg total) by mouth 2 (two) times daily. (Patient taking differently: Take 50 mg by mouth 2 (two) times daily.)  . omeprazole (PRILOSEC OTC) 20 MG tablet Take 20 mg by mouth daily.  Marland Kitchen OZEMPIC, 0.25 OR 0.5 MG/DOSE, 2 MG/1.5ML SOPN Inject 0.5 mg into the skin once a week.  . rosuvastatin (CRESTOR) 10 MG tablet Take 10 mg by mouth daily.  . sacubitril-valsartan (ENTRESTO) 49-51 MG Take 0.5 tablets by mouth 2 (two) times daily.  [name] 20 MG TABS tablet Take 20 mg by mouth daily.     Allergies:   Patient has no known allergies.   Social History  Socioeconomic History  . Marital status: Legally Separated    Spouse name: Not on file  . Number of children: Not on file  . Years of education: Not on file  . Highest education level: Not on file  Occupational History  . Not on file  Tobacco Use  . Smoking status: Former Smoker    Types: Cigars    Quit date: 12/11/2020    Years since quitting: 0.2  . Smokeless tobacco: Never Used  . Tobacco comment: stopped when he got out of the hospital in March 2022   Substance and Sexual Activity  . Alcohol use: Not Currently  . Drug use: Not Currently  . Sexual activity: Yes  Other Topics Concern  . Not on file  Social History Narrative  . Not on file   Social Determinants of Health   Financial Resource Strain: Not on file  Food Insecurity: Not on file  Transportation Needs: Not on file  Physical Activity: Not on file  Stress: Not on file  Social Connections: Not on file     Family History: The patient's family history includes Diabetes Mellitus II in his father and mother.  ROS:   Please see the history of present illness.    All other systems reviewed and are negative.  EKGs/Labs/Other Studies Reviewed:    The following studies were reviewed today:  January 09, 2021 TEE personally reviewed EF less than 20% Normal RV Dilated left and right atria Mild MR  EKG:  The ekg ordered today demonstrates sinus rhythm.  QTc 450 ms.  Recent Labs: 01/06/2021: B Natriuretic Peptide 1,638.8 01/09/2021: Magnesium 1.9; TSH 3.187 01/10/2021: BUN 27; Creatinine, Ser 1.34; Hemoglobin 16.4; Platelets 191; Potassium 4.3; Sodium 132  Recent Lipid Panel No results found for: CHOL, TRIG, HDL, CHOLHDL, VLDL, LDLCALC, LDLDIRECT  Physical Exam:    VS:  BP 122/88   Pulse 71   Ht 6\' 4"  (1.93 m)   Wt 229 lb (103.9 kg)   BMI 27.87 kg/m     Wt Readings from Last 3 Encounters:  03/13/21 229 lb (103.9 kg)  01/09/21 210 lb 1.6 oz (95.3 kg)  01/02/21 228 lb (103.4 kg)     GEN:  Well nourished, well developed in no acute distress HEENT: Normal NECK: No JVD; No carotid bruits LYMPHATICS: No lymphadenopathy CARDIAC: RRR, no murmurs, rubs, gallops RESPIRATORY:  Clear to auscultation without rales, wheezing or rhonchi  ABDOMEN: Soft, non-tender, non-distended MUSCULOSKELETAL:  No edema; No deformity  SKIN: Warm and dry NEUROLOGIC:  Alert and oriented x 3 PSYCHIATRIC:  Normal affect   ASSESSMENT:    No diagnosis found. PLAN:    In order of  problems listed above:  1. Persistent atrial fibrillation Admitted with highly symptomatic A. fib with RVR in the setting of acute heart failure in March 2022.  He is maintaining sinus rhythm with amiodarone.  He is using Xarelto for stroke prophylaxis.  He is very young and amiodarone is not a good long-term option.  Given his renal dysfunction there are limited options with antiarrhythmic therapy.  I discussed ablation in detail with the patient and his significant other during today's appointment including the risks, expected recovery time and efficacy.  He would like to proceed with scheduling an ablation.  Ablation strategy will be PVI plus posterior wall plus cavotricuspid isthmus ablation.  I will perform a full EP study to assess for any other supraventricular arrhythmias that were triggers of his atrial fibrillation.  He will need to repeat his  echocardiogram prior to the procedure.  Hopefully, his ejection fraction will have improved now that he is maintained normal rhythm.  In the meantime, keep a close eye on his weight.  Continue Entresto, metoprolol, Lasix and amiodarone.  Risk, benefits, and alternatives to EP study and radiofrequency ablation for afib were also discussed in detail today. These risks include but are not limited to stroke, bleeding, vascular damage, tamponade, perforation, damage to the esophagus, lungs, and other structures, pulmonary vein stenosis, worsening renal function, and death. The patient understands these risk and wishes to proceed.  We will therefore proceed with catheter ablation at the next available time.  Carto, ICE, anesthesia are requested for the procedure.  Will also obtain CT PV protocol prior to the procedure to exclude LAA thrombus and further evaluate atrial anatomy.  2.  Chronic systolic heart failure NYHA class II-III.  Warm and dry on exam.  Continue Entresto, Lopressor, Lasix, amiodarone.  3.  Hypertension Controlled.  Continue above  therapy.  Total time spent with patient today 50 minutes. This includes reviewing records, evaluating the patient and coordinating care.  Medication Adjustments/Labs and Tests Ordered: Current medicines are reviewed at length with the patient today.  Concerns regarding medicines are outlined above.  No orders of the defined types were placed in this encounter.  No orders of the defined types were placed in this encounter.    Signed, Rossie Muskrat. Lalla Brothers, MD, [facility], [facility] 03/13/2021 10:29 AM    Electrophysiology [facility]

## 2021-03-13 NOTE — Patient Instructions (Addendum)
Medication Instructions:  Your physician recommends that you continue on your current medications as directed. Please refer to the Current Medication list given to you today. *If you need a refill on your cardiac medications before your next appointment, please call your pharmacy*  Lab Work: None ordered. If you have labs (blood work) drawn today and your tests are completely normal, you will receive your results only by: Marland Kitchen MyChart Message (if you have MyChart) OR . A paper copy in the mail If you have any lab test that is abnormal or we need to change your treatment, we will call you to review the results.  Testing/Procedures: Your physician has requested that you have an echocardiogram. Echocardiography is a painless test that uses sound waves to create images of your heart. It provides your doctor with information about the size and shape of your heart and how well your heart's chambers and valves are working. This procedure takes approximately one hour. There are no restrictions for this procedure.  Please schedule for ECHO  Follow-Up: At [facility], you and your health needs are our priority.  As part of our continuing mission to provide you with exceptional heart care, we have created designated Provider Care Teams.  These Care Teams include your primary Cardiologist (physician) and Advanced Practice Providers (APPs -  Physician Assistants and Nurse Practitioners) who all work together to provide you with the care you need, when you need it.  Your next appointment:    SEE INSTRUCTION LETTER   Cardiac electrophysiology: From cell to bedside (7th ed., pp. 5176-1607). [place], [place]: Elsevier.">  Cardiac Ablation Cardiac ablation is a procedure to destroy, or ablate, a small amount of heart tissue in very specific places. The heart has many electrical connections. Sometimes these connections are abnormal and can cause the heart to beat very fast or irregularly. Ablating some of the  areas that cause problems can improve the heart's rhythm or return it to normal. Ablation may be done for people who:  Have Wolff-Parkinson-White syndrome.  Have fast heart rhythms (tachycardia).  Have taken medicines for an abnormal heart rhythm (arrhythmia) that were not effective or caused side effects.  Have a high-risk heartbeat that may be life-threatening. During the procedure, a small incision is made in the neck or the groin, and a long, thin tube (catheter) is inserted into the incision and moved to the heart. Small devices (electrodes) on the tip of the catheter will send out electrical currents. A type of X-ray (fluoroscopy) will be used to help guide the catheter and to provide images of the heart. Tell a health care provider about:  Any allergies you have.  All medicines you are taking, including vitamins, herbs, eye drops, creams, and over-the-counter medicines.  Any problems you or family members have had with anesthetic medicines.  Any blood disorders you have.  Any surgeries you have had.  Any medical conditions you have, such as kidney failure.  Whether you are pregnant or may be pregnant. What are the risks? Generally, this is a safe procedure. However, problems may occur, including:  Infection.  Bruising and bleeding at the catheter insertion site.  Bleeding into the chest, especially into the sac that surrounds the heart. This is a serious complication.  Stroke or blood clots.  Damage to nearby structures or organs.  Allergic reaction to medicines or dyes.  Need for a permanent pacemaker if the normal electrical system is damaged. A pacemaker is a small computer that sends electrical signals to the  heart and helps your heart beat normally.  The procedure not being fully effective. This may not be recognized until months later. Repeat ablation procedures are sometimes done. What happens before the procedure? Medicines Ask your health care provider  about:  Changing or stopping your regular medicines. This is especially important if you are taking diabetes medicines or blood thinners.  Taking medicines such as aspirin and ibuprofen. These medicines can thin your blood. Do not take these medicines unless your health care provider tells you to take them.  Taking over-the-counter medicines, vitamins, herbs, and supplements. General instructions  Follow instructions from your health care provider about eating or drinking restrictions.  Plan to have someone take you home from the hospital or clinic.  If you will be going home right after the procedure, plan to have someone with you for 24 hours.  Ask your health care provider what steps will be taken to prevent infection. What happens during the procedure?  An IV will be inserted into one of your veins.  You will be given a medicine to help you relax (sedative).  The skin on your neck or groin will be numbed.  An incision will be made in your neck or your groin.  A needle will be inserted through the incision and into a large vein in your neck or groin.  A catheter will be inserted into the needle and moved to your heart.  Dye may be injected through the catheter to help your surgeon see the area of the heart that needs treatment.  Electrical currents will be sent from the catheter to ablate heart tissue in desired areas. There are three types of energy that may be used to do this: ? Heat (radiofrequency energy). ? Laser energy. ? Extreme cold (cryoablation).  When the tissue has been ablated, the catheter will be removed.  Pressure will be held on the insertion area to prevent a lot of bleeding.  A bandage (dressing) will be placed over the insertion area. The exact procedure may vary among health care providers and hospitals.   What happens after the procedure?  Your blood pressure, heart rate, breathing rate, and blood oxygen level will be monitored until you leave the  hospital or clinic.  Your insertion area will be monitored for bleeding. You will need to lie still for a few hours to ensure that you do not bleed from the insertion area.  Do not drive for 24 hours or as long as told by your health care provider. Summary  Cardiac ablation is a procedure to destroy, or ablate, a small amount of heart tissue using an electrical current. This procedure can improve the heart rhythm or return it to normal.  Tell your health care provider about any medical conditions you may have and all medicines you are taking to treat them.  This is a safe procedure, but problems may occur. Problems may include infection, bruising, damage to nearby organs or structures, or allergic reactions to medicines.  Follow your health care provider's instructions about eating and drinking before the procedure. You may also be told to change or stop some of your medicines.  After the procedure, do not drive for 24 hours or as long as told by your health care provider. This information is not intended to replace advice given to you by your health care provider. Make sure you discuss any questions you have with your health care provider. Document Revised: 08/08/2019 Document Reviewed: 08/08/2019 Elsevier Patient Education  2021 Elsevier  Inc.

## 2021-04-23 ENCOUNTER — Other Ambulatory Visit
Admission: RE | Admit: 2021-04-23 | Discharge: 2021-04-23 | Disposition: A | Discharge status: Home / Self Care | Payer: BC Managed Care – PPO | Source: Ambulatory Visit | Attending: Cardiology | Admitting: Cardiology

## 2021-04-23 DIAGNOSIS — I4819 Other persistent atrial fibrillation: Secondary | ICD-10-CM | POA: Diagnosis not present

## 2021-04-23 LAB — BASIC METABOLIC PANEL
Anion gap: 8 (ref 5–15)
BUN: 19 mg/dL (ref 6–20)
CO2: 30 mmol/L (ref 22–32)
Calcium: 9.4 mg/dL (ref 8.9–10.3)
Chloride: 98 mmol/L (ref 98–111)
Creatinine, Ser: 1.45 mg/dL — ABNORMAL HIGH (ref 0.61–1.24)
GFR, Estimated: 60 mL/min — ABNORMAL LOW (ref >60)
Glucose, Bld: 176 mg/dL — ABNORMAL HIGH (ref 70–99)
Potassium: 4.4 mmol/L (ref 3.5–5.1)
Sodium: 136 mmol/L (ref 135–145)

## 2021-04-23 LAB — CBC WITH DIFFERENTIAL/PLATELET
Abs Immature Granulocytes: 0.01 10*3/uL (ref 0.00–0.07)
Basophils Absolute: 0.1 10*3/uL (ref 0.0–0.1)
Basophils Relative: 1 %
Eosinophils Absolute: 0.1 10*3/uL (ref 0.0–0.5)
Eosinophils Relative: 2 %
HCT: 48.2 % (ref 39.0–52.0)
Hemoglobin: 16.5 g/dL (ref 13.0–17.0)
Immature Granulocytes: 0 %
Lymphocytes Relative: 24 %
Lymphs Abs: 1.2 10*3/uL (ref 0.7–4.0)
MCH: 32.8 pg (ref 26.0–34.0)
MCHC: 34.2 g/dL (ref 30.0–36.0)
MCV: 95.8 fL (ref 80.0–100.0)
Monocytes Absolute: 0.6 10*3/uL (ref 0.1–1.0)
Monocytes Relative: 11 %
Neutro Abs: 3.1 10*3/uL (ref 1.7–7.7)
Neutrophils Relative %: 62 %
Platelets: 134 10*3/uL — ABNORMAL LOW (ref 150–400)
RBC: 5.03 MIL/uL (ref 4.22–5.81)
RDW: 13.5 % (ref 11.5–15.5)
WBC: 4.9 10*3/uL (ref 4.0–10.5)
nRBC: 0 % (ref 0.0–0.2)

## 2021-04-25 ENCOUNTER — Telehealth (HOSPITAL_COMMUNITY): Payer: Self-pay | Admitting: Emergency Medicine

## 2021-04-25 NOTE — Telephone Encounter (Signed)
Attempted to call patient regarding upcoming cardiac CT appointment. °Left message on voicemail with name and callback number °Brandilee Pies RN Navigator Cardiac Imaging °[facility] °[phone] Office °[phone] Cell ° °

## 2021-04-25 NOTE — Telephone Encounter (Signed)
Reaching out to patient to offer assistance regarding upcoming cardiac imaging study; pt verbalizes understanding of appt date/time, parking situation and where to check in, pre-test NPO status and medications ordered, and verified current allergies; name and call back number provided for further questions should they arise Rockwell Alexandria RN Navigator Cardiac Imaging [facility] [phone] office [phone] cell  Denies iv issues Denies claustro  Will take AM dose 100mg  metoprolol tart by 9am Holding lasix

## 2021-04-26 ENCOUNTER — Other Ambulatory Visit: Payer: Self-pay

## 2021-04-26 ENCOUNTER — Ambulatory Visit (INDEPENDENT_AMBULATORY_CARE_PROVIDER_SITE_OTHER): Payer: BC Managed Care – PPO

## 2021-04-26 ENCOUNTER — Ambulatory Visit
Admission: RE | Admit: 2021-04-26 | Discharge: 2021-04-26 | Disposition: A | Discharge status: Home / Self Care | Payer: BC Managed Care – PPO | Source: Ambulatory Visit | Attending: Cardiology | Admitting: Cardiology

## 2021-04-26 DIAGNOSIS — I4819 Other persistent atrial fibrillation: Secondary | ICD-10-CM | POA: Insufficient documentation

## 2021-04-26 LAB — ECHOCARDIOGRAM COMPLETE
AR max vel: 3.65 cm2
AV Area VTI: 3.88 cm2
AV Area mean vel: 3.5 cm2
AV Mean grad: 5 mmHg
AV Peak grad: 7.8 mmHg
Ao pk vel: 1.4 m/s
Area-P 1/2: 2.56 cm2
Calc EF: 54.7 %
S' Lateral: 3.5 cm
Single Plane A2C EF: 54.1 %
Single Plane A4C EF: 56.5 %

## 2021-04-26 MED ORDER — IOHEXOL 350 MG/ML SOLN
75.0000 mL | Freq: Once | INTRAVENOUS | Status: AC | PRN
  Administered 2021-04-26: 75 mL via INTRAVENOUS

## 2021-04-29 NOTE — Pre-Procedure Instructions (Signed)
Attempted to call patient regarding procedure instructions Left voice mail on the following items: Arrival time 0530 Nothing to eat or drink after midnight No meds AM of procedure Responsible person to drive you home and stay with you for 24 hrs  Have you missed any doses of anti-coagulant Xarelto-take today's dose none tomorrow

## 2021-04-30 ENCOUNTER — Ambulatory Visit (HOSPITAL_COMMUNITY): Payer: BC Managed Care – PPO | Admitting: Anesthesiology

## 2021-04-30 ENCOUNTER — Encounter (HOSPITAL_COMMUNITY): Payer: Self-pay | Admitting: Cardiology

## 2021-04-30 ENCOUNTER — Other Ambulatory Visit: Payer: Self-pay

## 2021-04-30 ENCOUNTER — Encounter (HOSPITAL_COMMUNITY)
Admission: RE | Disposition: A | Discharge status: Home / Self Care | Payer: Self-pay | Source: Home / Self Care | Attending: Cardiology

## 2021-04-30 ENCOUNTER — Ambulatory Visit (HOSPITAL_COMMUNITY)
Admission: RE | Admit: 2021-04-30 | Discharge: 2021-04-30 | Disposition: A | Discharge status: Home / Self Care | Payer: BC Managed Care – PPO | Attending: Cardiology | Admitting: Cardiology

## 2021-04-30 DIAGNOSIS — I428 Other cardiomyopathies: Secondary | ICD-10-CM | POA: Insufficient documentation

## 2021-04-30 DIAGNOSIS — E119 Type 2 diabetes mellitus without complications: Secondary | ICD-10-CM | POA: Insufficient documentation

## 2021-04-30 DIAGNOSIS — I110 Hypertensive heart disease with heart failure: Secondary | ICD-10-CM | POA: Diagnosis not present

## 2021-04-30 DIAGNOSIS — Z7901 Long term (current) use of anticoagulants: Secondary | ICD-10-CM | POA: Insufficient documentation

## 2021-04-30 DIAGNOSIS — Z87891 Personal history of nicotine dependence: Secondary | ICD-10-CM | POA: Diagnosis not present

## 2021-04-30 DIAGNOSIS — Z833 Family history of diabetes mellitus: Secondary | ICD-10-CM | POA: Insufficient documentation

## 2021-04-30 DIAGNOSIS — Z79899 Other long term (current) drug therapy: Secondary | ICD-10-CM | POA: Insufficient documentation

## 2021-04-30 DIAGNOSIS — I5022 Chronic systolic (congestive) heart failure: Secondary | ICD-10-CM | POA: Insufficient documentation

## 2021-04-30 DIAGNOSIS — I4891 Unspecified atrial fibrillation: Secondary | ICD-10-CM

## 2021-04-30 DIAGNOSIS — I4892 Unspecified atrial flutter: Secondary | ICD-10-CM | POA: Diagnosis not present

## 2021-04-30 DIAGNOSIS — I483 Typical atrial flutter: Secondary | ICD-10-CM | POA: Diagnosis not present

## 2021-04-30 HISTORY — PX: ATRIAL FIBRILLATION ABLATION: EP1191

## 2021-04-30 LAB — GLUCOSE, CAPILLARY
Glucose-Capillary: 192 mg/dL — ABNORMAL HIGH (ref 70–99)
Glucose-Capillary: 188 mg/dL — ABNORMAL HIGH (ref 70–99)

## 2021-04-30 LAB — POCT ACTIVATED CLOTTING TIME
Activated Clotting Time: 294 seconds
Activated Clotting Time: 393 seconds
Activated Clotting Time: 312 seconds
Activated Clotting Time: 335 seconds

## 2021-04-30 SURGERY — ATRIAL FIBRILLATION ABLATION
Anesthesia: General

## 2021-04-30 MED ORDER — ISOPROTERENOL HCL 0.2 MG/ML IJ SOLN
INTRAVENOUS | Status: DC | PRN
  Administered 2021-04-30: 2 ug/min via INTRAVENOUS

## 2021-04-30 MED ORDER — COLCHICINE 0.6 MG PO TABS: 0.6000 mg | ORAL_TABLET | Freq: Two times a day (BID) | ORAL | 0 refills | Status: DC

## 2021-04-30 MED ORDER — SUCCINYLCHOLINE CHLORIDE 200 MG/10ML IV SOSY
PREFILLED_SYRINGE | INTRAVENOUS | Status: DC | PRN
  Administered 2021-04-30: 120 mg via INTRAVENOUS

## 2021-04-30 MED ORDER — SODIUM CHLORIDE 0.9% FLUSH: 3.0000 mL | Freq: Two times a day (BID) | INTRAVENOUS | Status: DC

## 2021-04-30 MED ORDER — FUROSEMIDE 20 MG PO TABS: 30.0000 mg | ORAL_TABLET | Freq: Every day | ORAL | Status: AC

## 2021-04-30 MED ORDER — ISOPROTERENOL HCL 0.2 MG/ML IJ SOLN
INTRAMUSCULAR | Status: AC
  Filled 2021-04-30: qty 5

## 2021-04-30 MED ORDER — HEPARIN SODIUM (PORCINE) 1000 UNIT/ML IJ SOLN
INTRAMUSCULAR | Status: DC | PRN
  Administered 2021-04-30: 4000 [IU] via INTRAVENOUS
  Administered 2021-04-30: 17000 [IU] via INTRAVENOUS
  Administered 2021-04-30: 4000 [IU] via INTRAVENOUS

## 2021-04-30 MED ORDER — ROCURONIUM BROMIDE 10 MG/ML (PF) SYRINGE
PREFILLED_SYRINGE | INTRAVENOUS | Status: DC | PRN
  Administered 2021-04-30: 60 mg via INTRAVENOUS

## 2021-04-30 MED ORDER — ONDANSETRON HCL 4 MG/2ML IJ SOLN
INTRAMUSCULAR | Status: DC | PRN
  Administered 2021-04-30: 4 mg via INTRAVENOUS

## 2021-04-30 MED ORDER — PHENYLEPHRINE HCL-NACL 10-0.9 MG/250ML-% IV SOLN
INTRAVENOUS | Status: DC | PRN
  Administered 2021-04-30: 25 ug/min via INTRAVENOUS

## 2021-04-30 MED ORDER — SUGAMMADEX SODIUM 200 MG/2ML IV SOLN
INTRAVENOUS | Status: DC | PRN
  Administered 2021-04-30: 200 mg via INTRAVENOUS

## 2021-04-30 MED ORDER — RIVAROXABAN 20 MG PO TABS
20.0000 mg | ORAL_TABLET | Freq: Every day | ORAL | Status: DC
  Administered 2021-04-30: 20 mg via ORAL
  Filled 2021-04-30: qty 1

## 2021-04-30 MED ORDER — PROPOFOL 10 MG/ML IV BOLUS
INTRAVENOUS | Status: DC | PRN
  Administered 2021-04-30: 160 mg via INTRAVENOUS

## 2021-04-30 MED ORDER — PHENYLEPHRINE 40 MCG/ML (10ML) SYRINGE FOR IV PUSH (FOR BLOOD PRESSURE SUPPORT)
PREFILLED_SYRINGE | INTRAVENOUS | Status: DC | PRN
  Administered 2021-04-30: 80 ug via INTRAVENOUS

## 2021-04-30 MED ORDER — FENTANYL CITRATE (PF) 100 MCG/2ML IJ SOLN
INTRAMUSCULAR | Status: DC | PRN
  Administered 2021-04-30: 100 ug via INTRAVENOUS

## 2021-04-30 MED ORDER — ONDANSETRON HCL 4 MG/2ML IJ SOLN: 4.0000 mg | Freq: Four times a day (QID) | INTRAMUSCULAR | Status: DC | PRN

## 2021-04-30 MED ORDER — HEPARIN (PORCINE) IN NACL 1000-0.9 UT/500ML-% IV SOLN
INTRAVENOUS | Status: DC | PRN
  Administered 2021-04-30 (×4): 500 mL

## 2021-04-30 MED ORDER — ACETAMINOPHEN 325 MG PO TABS
ORAL_TABLET | ORAL | Status: AC
  Administered 2021-04-30: 650 mg
  Filled 2021-04-30: qty 2

## 2021-04-30 MED ORDER — ACETAMINOPHEN 325 MG PO TABS
650.0000 mg | ORAL_TABLET | ORAL | Status: DC | PRN
  Filled 2021-04-30: qty 2

## 2021-04-30 MED ORDER — LIDOCAINE 2% (20 MG/ML) 5 ML SYRINGE
INTRAMUSCULAR | Status: DC | PRN
  Administered 2021-04-30: 40 mg via INTRAVENOUS

## 2021-04-30 MED ORDER — DEXAMETHASONE SODIUM PHOSPHATE 10 MG/ML IJ SOLN
INTRAMUSCULAR | Status: DC | PRN
  Administered 2021-04-30: 5 mg via INTRAVENOUS

## 2021-04-30 MED ORDER — SODIUM CHLORIDE 0.9 % IV SOLN: INTRAVENOUS | Status: DC

## 2021-04-30 MED ORDER — HEPARIN SODIUM (PORCINE) 1000 UNIT/ML IJ SOLN
INTRAMUSCULAR | Status: DC | PRN
Start: 2021-04-30 — End: 2021-04-30
  Administered 2021-04-30: 1000 [IU] via INTRAVENOUS

## 2021-04-30 MED ORDER — SODIUM CHLORIDE 0.9 % IV SOLN: 250.0000 mL | INTRAVENOUS | Status: DC | PRN

## 2021-04-30 MED ORDER — SODIUM CHLORIDE 0.9% FLUSH: 3.0000 mL | INTRAVENOUS | Status: DC | PRN

## 2021-04-30 MED ORDER — MIDAZOLAM HCL 2 MG/2ML IJ SOLN
INTRAMUSCULAR | Status: DC | PRN
  Administered 2021-04-30: 2 mg via INTRAVENOUS

## 2021-04-30 MED ORDER — PROTAMINE SULFATE 10 MG/ML IV SOLN
INTRAVENOUS | Status: DC | PRN
  Administered 2021-04-30: 30 mg via INTRAVENOUS

## 2021-04-30 SURGICAL SUPPLY — 23 items
BLANKET WARM UNDERBOD FULL ACC (MISCELLANEOUS) ×2 IMPLANT
CATH 8FR REPROCESSED SOUNDSTAR (CATHETERS) ×2 IMPLANT
CATH INFINITI JR4 5F (CATHETERS) ×2 IMPLANT
CATH JSN HEX 2-5-2 120 (CATHETERS) ×2 IMPLANT
CATH OCTARAY 2.0 F 3-3-3-3-3 (CATHETERS) ×2 IMPLANT
CATH S CIRCA THERM PROBE 10F (CATHETERS) ×2 IMPLANT
CATH SMTCH THERMOCOOL SF DF (CATHETERS) ×2 IMPLANT
CATH WEB BI DIR CSDF CRV REPRO (CATHETERS) ×2 IMPLANT
CLOSURE PERCLOSE PROSTYLE (VASCULAR PRODUCTS) ×6 IMPLANT
COVER SWIFTLINK CONNECTOR (BAG) ×2 IMPLANT
KIT VERSACROSS LRG ACCESS (CATHETERS) ×2 IMPLANT
MAT PREVALON FULL STRYKER (MISCELLANEOUS) ×2 IMPLANT
PACK EP LATEX FREE (CUSTOM PROCEDURE TRAY) ×2
PACK EP LF (CUSTOM PROCEDURE TRAY) ×1 IMPLANT
PAD PRO RADIOLUCENT 2001M-C (PAD) ×2 IMPLANT
PATCH CARTO3 (PAD) ×2 IMPLANT
SHEATH BAYLIS TRANSSEPTAL 98CM (NEEDLE) ×2 IMPLANT
SHEATH CARTO VIZIGO SM CVD (SHEATH) ×2 IMPLANT
SHEATH PINNACLE 8F 10CM (SHEATH) ×4 IMPLANT
SHEATH PINNACLE 9F 10CM (SHEATH) ×2 IMPLANT
SHEATH PROBE COVER 6X72 (BAG) ×2 IMPLANT
TUBING SMART ABLATE COOLFLOW (TUBING) ×2 IMPLANT
WIRE HI TORQ VERSACORE-J 145CM (WIRE) ×2 IMPLANT

## 2021-04-30 NOTE — H&P (Signed)
Electrophysiology Office Note:    Date:  03/13/2021    ID:  George Shaw, DOB [birth year], MRN [id number]   PCP:  Lorenso Quarry, NP              [facility] Electrophysiologist:  Lanier Prude, MD    Referring MD: Lorenso Quarry, NP    Chief Complaint: Atrial fibrillation, nonischemic cardiomyopathy   History of Present Illness:     George Shaw is a 50 y.o. male who presents for an evaluation of atrial fibrillation and nonischemic cardiomyopathy at the request of Dr. Darrold Junker. Their medical history includes diabetes, hypertension, nonischemic cardiomyopathy and atrial fibrillation with rapid ventricular response.   Last saw the patient when he was hospitalized for acute systolic heart failure on January 09, 2021.  He was admitted with RVR and ventricular rates greater than 200 bpm.  He was hypotensive and required emergent cardioversion.  He was diuresed over 10 L during the hospitalization with improvement of his symptoms.  In the setting of A. fib with RVR, his ventricular function was severely decreased at less than 20%.  He was started on amiodarone and is now taking 200 mg by mouth daily.  Thankfully this is helped to maintain normal rhythm.  Since leaving the hospital, the patient is doing significantly better.  He did have to increase his Lasix dose to maintain euvolemia.  He is doing well on his anticoagulant and is now taking Xarelto 20 mg by mouth once daily.  He has had no trouble with bleeding.  He is interested in coming off the amiodarone given his potential for off target effects.   He is with his significant other in clinic today.       Past Medical History:  Diagnosis Date   Arrhythmia      atrial fibrillation   CHF (congestive heart failure) (HCC)     Diabetes mellitus without complication (HCC)     Hypertension             Past Surgical History:  Procedure Laterality Date   CARDIOVERSION N/A 01/09/2021    Procedure: CARDIOVERSION;  Surgeon: Iran Ouch, MD;  Location: [facility] ORS;  Service: Cardiovascular;  Laterality: N/A;   LEFT HEART CATH AND CORONARY ANGIOGRAPHY N/A 01/07/2021    Procedure: LEFT HEART CATH AND CORONARY ANGIOGRAPHY with possible PCI and stent;  Surgeon: Alwyn Pea, MD;  Location: [facility] INVASIVE CV LAB;  Service: Cardiovascular;  Laterality: N/A;   TEE WITHOUT CARDIOVERSION N/A 01/09/2021    Procedure: TRANSESOPHAGEAL ECHOCARDIOGRAM (TEE);  Surgeon: Iran Ouch, MD;  Location: [facility] ORS;  Service: Cardiovascular;  Laterality: N/A;  Dr. Steffanie Dunn procedure (Epic won't let me assign him for TEE/DCCV)      Current Medications:     Current Meds  Medication Sig   amiodarone (PACERONE) 200 MG tablet Take 200 mg by mouth daily.   furosemide (LASIX) 20 MG tablet Take 1 tablet (20 mg total) by mouth daily.   magnesium oxide (MAG-OX) 400 MG tablet Take 1 tablet by mouth daily.   metoprolol tartrate (LOPRESSOR) 100 MG tablet Take 1 tablet (100 mg total) by mouth 2 (two) times daily. (Patient taking differently: Take 50 mg by mouth 2 (two) times daily.)   omeprazole (PRILOSEC OTC) 20 MG tablet Take 20 mg by mouth daily.   OZEMPIC, 0.25 OR 0.5 MG/DOSE, 2 MG/1.5ML SOPN Inject 0.5 mg into the skin once a week.   rosuvastatin (CRESTOR) 10 MG tablet Take 10 mg by mouth  daily.   sacubitril-valsartan (ENTRESTO) 49-51 MG Take 0.5 tablets by mouth 2 (two) times daily.   XARELTO 20 MG TABS tablet Take 20 mg by mouth daily.      Allergies:   Patient has no known allergies.    Social History         Socioeconomic History   Marital status: Legally Separated      Spouse name: Not on file   Number of children: Not on file   Years of education: Not on file   Highest education level: Not on file  Occupational History   Not on file  Tobacco Use   Smoking status: Former Smoker      Types: Cigars      Quit date: 12/11/2020      Years since quitting: 0.2   Smokeless tobacco: Never Used   Tobacco comment: stopped  when he got out of the hospital in March 2022  Substance and Sexual Activity   Alcohol use: Not Currently   Drug use: Not Currently   Sexual activity: Yes  Other Topics Concern   Not on file  Social History Narrative   Not on file    Social Determinants of Health    Financial Resource Strain: Not on file  Food Insecurity: Not on file  Transportation Needs: Not on file  Physical Activity: Not on file  Stress: Not on file  Social Connections: Not on file      Family History: The patient's family history includes Diabetes Mellitus II in his father and mother.   ROS:   Please see the history of present illness.    All other systems reviewed and are negative.   EKGs/Labs/Other Studies Reviewed:     The following studies were reviewed today:   January 09, 2021 TEE personally reviewed EF less than 20% Normal RV Dilated left and right atria Mild MR   EKG:  The ekg ordered today demonstrates sinus rhythm.  QTc 450 ms.   Recent Labs: 01/06/2021: B Natriuretic Peptide 1,638.8 01/09/2021: Magnesium 1.9; TSH 3.187 01/10/2021: BUN 27; Creatinine, Ser 1.34; Hemoglobin 16.4; Platelets 191; Potassium 4.3; Sodium 132  Recent Lipid Panel No results found for: CHOL, TRIG, HDL, CHOLHDL, VLDL, LDLCALC, LDLDIRECT   Physical Exam:    VS:  BP 122/88   Pulse 71   Ht 6\' 4"  (1.93 m)   Wt 229 lb (103.9 kg)   BMI 27.87 kg/m         Wt Readings from Last 3 Encounters:  03/13/21 229 lb (103.9 kg)  01/09/21 210 lb 1.6 oz (95.3 kg)  01/02/21 228 lb (103.4 kg)      GEN:  Well nourished, well developed in no acute distress HEENT: Normal NECK: No JVD; No carotid bruits LYMPHATICS: No lymphadenopathy CARDIAC: RRR, no murmurs, rubs, gallops RESPIRATORY:  Clear to auscultation without rales, wheezing or rhonchi ABDOMEN: Soft, non-tender, non-distended MUSCULOSKELETAL:  No edema; No deformity SKIN: Warm and dry NEUROLOGIC:  Alert and oriented x 3 PSYCHIATRIC:  Normal affect   ASSESSMENT:      No diagnosis found. PLAN:    In order of problems listed above:   Persistent atrial fibrillation Admitted with highly symptomatic A. fib with RVR in the setting of acute heart failure in March 2022.  He is maintaining sinus rhythm with amiodarone.  He is using Xarelto for stroke prophylaxis.  He is very young and amiodarone is not a good long-term option.  Given his renal dysfunction there are limited options with antiarrhythmic  therapy.  I discussed ablation in detail with the patient and his significant other during today's appointment including the risks, expected recovery time and efficacy.  He would like to proceed with scheduling an ablation.   Ablation strategy will be PVI plus posterior wall plus cavotricuspid isthmus ablation.  I will perform a full EP study to assess for any other supraventricular arrhythmias that were triggers of his atrial fibrillation.   He will need to repeat his echocardiogram prior to the procedure.  Hopefully, his ejection fraction will have improved now that he is maintained normal rhythm.   In the meantime, keep a close eye on his weight.  Continue Entresto, metoprolol, Lasix and amiodarone.   Risk, benefits, and alternatives to EP study and radiofrequency ablation for afib were also discussed in detail today. These risks include but are not limited to stroke, bleeding, vascular damage, tamponade, perforation, damage to the esophagus, lungs, and other structures, pulmonary vein stenosis, worsening renal function, and death. The patient understands these risk and wishes to proceed.  We will therefore proceed with catheter ablation at the next available time.  Carto, ICE, anesthesia are requested for the procedure.  Will also obtain CT PV protocol prior to the procedure to exclude LAA thrombus and further evaluate atrial anatomy.   2.  Chronic systolic heart failure NYHA class II-III.  Warm and dry on exam.  Continue Entresto, Lopressor, Lasix, amiodarone.    3.  Hypertension Controlled.  Continue above therapy.   Total time spent with patient today 50 minutes. This includes reviewing records, evaluating the patient and coordinating care.   ----------------------------------------------------------------------  I have seen, examined the patient, and reviewed the above assessment and plan.    Plan for PVI today.   Lanier Prude, MD 04/30/2021 7:22 AM

## 2021-04-30 NOTE — Anesthesia Procedure Notes (Signed)
Procedure Name: Intubation Date/Time: 04/30/2021 7:43 AM Performed by: Valda Favia, CRNA Pre-anesthesia Checklist: Patient identified, Emergency Drugs available, Suction available, Patient being monitored and Timeout performed Patient Re-evaluated:Patient Re-evaluated prior to induction Oxygen Delivery Method: Circle system utilized Preoxygenation: Pre-oxygenation with 100% oxygen Induction Type: IV induction and Rapid sequence Laryngoscope Size: Mac and 4 Grade View: Grade I Tube type: Oral Tube size: 8.0 mm Number of attempts: 1 Airway Equipment and Method: Stylet Placement Confirmation: ETT inserted through vocal cords under direct vision, positive ETCO2 and breath sounds checked- equal and bilateral Secured at: 23 cm Tube secured with: Tape Dental Injury: Teeth and Oropharynx as per pre-operative assessment

## 2021-04-30 NOTE — Transfer of Care (Signed)
Immediate Anesthesia Transfer of Care Note  Patient: George Shaw  Procedure(s) Performed: ATRIAL FIBRILLATION ABLATION  Patient Location: Cath Lab  Anesthesia Type:General  Level of Consciousness: awake, alert  and oriented  Airway & Oxygen Therapy: Patient Spontanous Breathing and Patient connected to nasal cannula oxygen  Post-op Assessment: Report given to RN and Post -op Vital signs reviewed and stable  Post vital signs: Reviewed and stable  Last Vitals:  Vitals Value Taken Time  BP 134/82 04/30/21 1109  Temp 36 C 04/30/21 1105  Pulse 80 04/30/21 1111  Resp 16 04/30/21 1111  SpO2 99 % 04/30/21 1111  Vitals shown include unvalidated device data.  Last Pain:  Vitals:   04/30/21 1105  TempSrc: Temporal  PainSc: 0-No pain         Complications: No notable events documented.

## 2021-04-30 NOTE — Anesthesia Preprocedure Evaluation (Addendum)
Anesthesia Evaluation  Patient identified by MRN, date of birth, ID band Patient awake    Reviewed: Allergy & Precautions, NPO status , Patient's Chart, lab work & pertinent test results  Airway Mallampati: I  TM Distance: >3 FB Neck ROM: Full    Dental  (+) Teeth Intact, Dental Advisory Given   Pulmonary former smoker,    Pulmonary exam normal        Cardiovascular hypertension, Pt. on home beta blockers and Pt. on medications +CHF  + dysrhythmias Atrial Fibrillation  Rhythm:Regular Rate:Normal  Echo:  1. Left ventricular ejection fraction, by estimation, is 60 to 65%. The  left ventricle has normal function. The left ventricle has no regional  wall motion abnormalities. There is mild left ventricular hypertrophy.  Left ventricular diastolic parameters  are consistent with Grade I diastolic dysfunction (impaired relaxation).  The average left ventricular global longitudinal strain is -17.5 %. The  global longitudinal strain is normal.  2. Right ventricular systolic function is normal. The right ventricular  size is normal.  3. Left atrial size was mildly dilated.  4. The mitral valve is normal in structure. Mild mitral valve  regurgitation.    Neuro/Psych negative neurological ROS  negative psych ROS   GI/Hepatic Neg liver ROS, GERD  Medicated,  Endo/Other  diabetes, Type 2, Oral Hypoglycemic Agents  Renal/GU Renal InsufficiencyRenal disease     Musculoskeletal   Abdominal Normal abdominal exam  (+)   Peds  Hematology negative hematology ROS (+)   Anesthesia Other Findings   Reproductive/Obstetrics                            Anesthesia Physical Anesthesia Plan  ASA: 3  Anesthesia Plan: General   Post-op Pain Management:    Induction: Intravenous  PONV Risk Score and Plan: 2 and Ondansetron, Dexamethasone and Midazolam  Airway Management Planned: Oral ETT  Additional  Equipment: None  Intra-op Plan:   Post-operative Plan: Extubation in OR  Informed Consent: I have reviewed the patients History and Physical, chart, labs and discussed the procedure including the risks, benefits and alternatives for the proposed anesthesia with the patient or authorized representative who has indicated his/her understanding and acceptance.     Dental advisory given  Plan Discussed with: CRNA  Anesthesia Plan Comments:        Anesthesia Quick Evaluation

## 2021-05-02 NOTE — Anesthesia Postprocedure Evaluation (Signed)
Anesthesia Post Note  Patient: George Shaw  Procedure(s) Performed: ATRIAL FIBRILLATION ABLATION     Patient location during evaluation: PACU Anesthesia Type: General Level of consciousness: awake and alert Pain management: pain level controlled Vital Signs Assessment: post-procedure vital signs reviewed and stable Respiratory status: spontaneous breathing, nonlabored ventilation, respiratory function stable and patient connected to nasal cannula oxygen Cardiovascular status: blood pressure returned to baseline and stable Postop Assessment: no apparent nausea or vomiting Anesthetic complications: no   No notable events documented.  Last Vitals:  Vitals:   04/30/21 1300 04/30/21 1330  BP: (!) 144/92 (!) 146/92  Pulse: 78 79  Resp: 13 13  Temp:    SpO2: 99% 100%    Last Pain:  Vitals:   04/30/21 1239  TempSrc:   PainSc: 5                  Shelton Silvas

## 2021-05-28 ENCOUNTER — Ambulatory Visit (HOSPITAL_COMMUNITY)
Admission: RE | Admit: 2021-05-28 | Discharge: 2021-05-28 | Disposition: A | Discharge status: Home / Self Care | Payer: BC Managed Care – PPO | Source: Ambulatory Visit | Attending: Nurse Practitioner | Admitting: Nurse Practitioner

## 2021-05-28 ENCOUNTER — Encounter (HOSPITAL_COMMUNITY): Payer: Self-pay | Admitting: Nurse Practitioner

## 2021-05-28 ENCOUNTER — Other Ambulatory Visit: Payer: Self-pay

## 2021-05-28 VITALS — BP 124/80 | HR 72 | Ht 75.0 in | Wt 231.4 lb

## 2021-05-28 DIAGNOSIS — I4819 Other persistent atrial fibrillation: Secondary | ICD-10-CM

## 2021-05-28 DIAGNOSIS — Z7901 Long term (current) use of anticoagulants: Secondary | ICD-10-CM | POA: Insufficient documentation

## 2021-05-28 DIAGNOSIS — I110 Hypertensive heart disease with heart failure: Secondary | ICD-10-CM | POA: Diagnosis not present

## 2021-05-28 DIAGNOSIS — I4891 Unspecified atrial fibrillation: Secondary | ICD-10-CM | POA: Diagnosis present

## 2021-05-28 DIAGNOSIS — Z87891 Personal history of nicotine dependence: Secondary | ICD-10-CM | POA: Diagnosis not present

## 2021-05-28 DIAGNOSIS — Z79899 Other long term (current) drug therapy: Secondary | ICD-10-CM | POA: Insufficient documentation

## 2021-05-28 DIAGNOSIS — D6869 Other thrombophilia: Secondary | ICD-10-CM | POA: Diagnosis not present

## 2021-05-28 NOTE — Progress Notes (Signed)
Primary Care Physician: Lorenso Quarry, NP Referring Physician: Dr. Conard Novak is a 50 y.o. male with a h/o afib that is in the afib clinic for f/u ablation one month ago. He has not noted any afib. No swallowing or groin issues. He remains on amiodarone and xarelto.   Today, he denies symptoms of palpitations, chest pain, shortness of breath, orthopnea, PND, lower extremity edema, dizziness, presyncope, syncope, or neurologic sequela. The patient is tolerating medications without difficulties and is otherwise without complaint today.   Past Medical History:  Diagnosis Date   Arrhythmia    atrial fibrillation   CHF (congestive heart failure) (HCC)    Diabetes mellitus without complication (HCC)    Hypertension    Past Surgical History:  Procedure Laterality Date   ATRIAL FIBRILLATION ABLATION N/A 04/30/2021   Procedure: ATRIAL FIBRILLATION ABLATION;  Surgeon: Lanier Prude, MD;  Location: [facility] INVASIVE CV LAB;  Service: Cardiovascular;  Laterality: N/A;   CARDIOVERSION N/A 01/09/2021   Procedure: CARDIOVERSION;  Surgeon: Iran Ouch, MD;  Location: [facility] ORS;  Service: Cardiovascular;  Laterality: N/A;   LEFT HEART CATH AND CORONARY ANGIOGRAPHY N/A 01/07/2021   Procedure: LEFT HEART CATH AND CORONARY ANGIOGRAPHY with possible PCI and stent;  Surgeon: Alwyn Pea, MD;  Location: [facility] INVASIVE CV LAB;  Service: Cardiovascular;  Laterality: N/A;   TEE WITHOUT CARDIOVERSION N/A 01/09/2021   Procedure: TRANSESOPHAGEAL ECHOCARDIOGRAM (TEE);  Surgeon: Iran Ouch, MD;  Location: [facility] ORS;  Service: Cardiovascular;  Laterality: N/A;  Dr. Steffanie Dunn procedure (Epic won't let me assign him for TEE/DCCV)    Current Outpatient Medications  Medication Sig Dispense Refill   amiodarone (PACERONE) 200 MG tablet Take 200 mg by mouth daily.     furosemide (LASIX) 20 MG tablet Take 1.5 tablets (30 mg total) by mouth daily.     magnesium oxide (MAG-OX) 400 MG  tablet Take 400 mg by mouth daily.     metoprolol tartrate (LOPRESSOR) 100 MG tablet Take 1 tablet (100 mg total) by mouth 2 (two) times daily. 60 tablet 3   omeprazole (PRILOSEC OTC) 20 MG tablet Take 20 mg by mouth daily.     rosuvastatin (CRESTOR) 10 MG tablet Take 10 mg by mouth daily.     sacubitril-valsartan (ENTRESTO) 49-51 MG Take 0.5 tablets by mouth 2 (two) times daily.     Semaglutide, 1 MG/DOSE, (OZEMPIC, 1 MG/DOSE,) 2 MG/1.5ML SOPN Inject 1 mg into the skin every Saturday.     XARELTO 20 MG TABS tablet Take 20 mg by mouth daily.     colchicine 0.6 MG tablet Take 1 tablet (0.6 mg total) by mouth 2 (two) times daily for 7 days. 14 tablet 0   No current facility-administered medications for this encounter.    No Known Allergies  Social History   Socioeconomic History   Marital status: Divorced    Spouse name: Not on file   Number of children: Not on file   Years of education: Not on file   Highest education level: Not on file  Occupational History   Not on file  Tobacco Use   Smoking status: Former    Types: Cigars    Quit date: 12/11/2020    Years since quitting: 0.4   Smokeless tobacco: Never   Tobacco comments:    stopped when he got out of the hospital in March 2022  Substance and Sexual Activity   Alcohol use: Not Currently   Drug use: Not  Currently   Sexual activity: Yes  Other Topics Concern   Not on file  Social History Narrative   Not on file   Social Determinants of Health   Financial Resource Strain: Not on file  Food Insecurity: Not on file  Transportation Needs: Not on file  Physical Activity: Not on file  Stress: Not on file  Social Connections: Not on file  Intimate Partner Violence: Not on file    Family History  Problem Relation Age of Onset   Diabetes Mellitus II Mother    Diabetes Mellitus II Father     ROS- All systems are reviewed and negative except as per the HPI above  Physical Exam: Vitals:   05/28/21 1514  BP: 124/80   Pulse: 72  Weight: 105 kg  Height: 6\' 3"  (1.905 m)   Wt Readings from Last 3 Encounters:  05/28/21 105 kg  04/30/21 99.8 kg  03/13/21 103.9 kg    Labs: Lab Results  Component Value Date   NA 136 04/23/2021   K 4.4 04/23/2021   CL 98 04/23/2021   CO2 30 04/23/2021   GLUCOSE 176 (H) 04/23/2021   BUN 19 04/23/2021   CREATININE 1.45 (H) 04/23/2021   CALCIUM 9.4 04/23/2021   MG 1.9 01/09/2021   Lab Results  Component Value Date   INR 1.1 01/09/2021   No results found for: CHOL, HDL, LDLCALC, TRIG   GEN- The patient is well appearing, alert and oriented x 3 today.   Head- normocephalic, atraumatic Eyes-  Sclera clear, conjunctiva pink Ears- hearing intact Oropharynx- clear Neck- supple, no JVP Lymph- no cervical lymphadenopathy Lungs- Clear to ausculation bilaterally, normal work of breathing Heart- Regular rate and rhythm, no murmurs, rubs or gallops, PMI not laterally displaced GI- soft, NT, ND, + BS Extremities- no clubbing, cyanosis, or edema MS- no significant deformity or atrophy Skin- no rash or lesion Psych- euthymic mood, full affect Neuro- strength and sensation are intact  EKG-NSR at 72 bpm, pr int 168 ms, qrs int 90 ms, qtc 459 ms  Epic records reviewed   Assessment and Plan:  1. Afib  S/p ablation one month ago Maintaining  SR  Continue amiodarone for now, will likely be stopped at 3 month f/u   2. [id number]  score of  3 Continue xarelto 20 mg daily Do not interrupt for elective procedures during the 3 month healing period  I do not see where he has a f/u with Dr. 01/11/2021 at 3 months and will request   Lalla Brothers C. Lup[facility] [address] [place], [place] [phone]

## 2021-07-31 ENCOUNTER — Ambulatory Visit: Payer: BC Managed Care – PPO | Admitting: Cardiology

## 2021-08-13 NOTE — Progress Notes (Signed)
Electrophysiology Office Follow up Visit Note:    Date:  08/14/2021   ID:  George Shaw, DOB October 28, 1970, MRN [id number]  PCP:  Lorenso Quarry, NP  [facility] Cardiologist:  None  [facility] Electrophysiologist:  Lanier Prude, MD    Interval History:    George Shaw is a 50 y.o. male who presents for a follow up visit after PVI on 04/30/2021. During the procedure the PV and CTI were ablated. He was seen by Rudi Coco on 05/28/2021. At that appointment he was maintaining sinus rhythm. He is maintained on xarelto for stroke ppx periprocedurally. He is done quite well since the ablation.  Groin sites healed well.  No recurrence of atrial fibrillation.  Still taking his Xarelto for stroke prophylaxis.  He tells me that his blood pressures have been elevated for the last week.      Past Medical History:  Diagnosis Date   Arrhythmia    atrial fibrillation   CHF (congestive heart failure) (HCC)    Diabetes mellitus without complication (HCC)    Hypertension     Past Surgical History:  Procedure Laterality Date   ATRIAL FIBRILLATION ABLATION N/A 04/30/2021   Procedure: ATRIAL FIBRILLATION ABLATION;  Surgeon: Lanier Prude, MD;  Location: [facility];  Service: Cardiovascular;  Laterality: N/A;   CARDIOVERSION N/A 01/09/2021   Procedure: CARDIOVERSION;  Surgeon: Iran Ouch, MD;  Location: [facility] ORS;  Service: Cardiovascular;  Laterality: N/A;   LEFT HEART CATH AND CORONARY ANGIOGRAPHY N/A 01/07/2021   Procedure: LEFT HEART CATH AND CORONARY ANGIOGRAPHY with possible PCI and stent;  Surgeon: Alwyn Pea, MD;  Location: [facility] INVASIVE CV LAB;  Service: Cardiovascular;  Laterality: N/A;   TEE WITHOUT CARDIOVERSION N/A 01/09/2021   Procedure: TRANSESOPHAGEAL ECHOCARDIOGRAM (TEE);  Surgeon: Iran Ouch, MD;  Location: [facility] ORS;  Service: Cardiovascular;  Laterality: N/A;  Dr. Steffanie Dunn procedure (Epic won't let me assign him for TEE/DCCV)     Current Medications: Current Meds  Medication Sig   furosemide (LASIX) 20 MG tablet Take 1.5 tablets (30 mg total) by mouth daily.   magnesium oxide (MAG-OX) 400 MG tablet Take 400 mg by mouth daily.   metoprolol tartrate (LOPRESSOR) 100 MG tablet Take 1 tablet (100 mg total) by mouth 2 (two) times daily.   omeprazole (PRILOSEC OTC) 20 MG tablet Take 20 mg by mouth daily.   rosuvastatin (CRESTOR) 10 MG tablet Take 10 mg by mouth daily.   sacubitril-valsartan (ENTRESTO) 49-51 MG Take 0.5 tablets by mouth 2 (two) times daily.   TRULICITY 0.75 MG/0.5ML SOPN SMARTSIG:0.5 Milliliter(s) SUB-Q Once a Week   XARELTO 20 MG TABS tablet Take 20 mg by mouth daily.   [DISCONTINUED] amiodarone (PACERONE) 200 MG tablet Take 200 mg by mouth daily.     Allergies:   Patient has no known allergies.   Social History   Socioeconomic History   Marital status: Divorced    Spouse name: Not on file   Number of children: Not on file   Years of education: Not on file   Highest education level: Not on file  Occupational History   Not on file  Tobacco Use   Smoking status: Former    Types: Cigars    Quit date: 12/11/2020    Years since quitting: 0.6   Smokeless tobacco: Never   Tobacco comments:    stopped when he got out of the hospital in March 2022  Substance and Sexual Activity   Alcohol use: Not  Currently   Drug use: Not Currently   Sexual activity: Yes  Other Topics Concern   Not on file  Social History Narrative   Not on file   Social Determinants of Health   Financial Resource Strain: Not on file  Food Insecurity: Not on file  Transportation Needs: Not on file  Physical Activity: Not on file  Stress: Not on file  Social Connections: Not on file     Family History: The patient's family history includes Diabetes Mellitus II in his father and mother.  ROS:   Please see the history of present illness.    All other systems reviewed and are negative.  EKGs/Labs/Other Studies  Reviewed:    The following studies were reviewed today:   EKG:  The ekg ordered today demonstrates sinus rhythm.  Normal intervals.  Ventricular rate 66.  Recent Labs: 01/06/2021: B Natriuretic Peptide 1,638.8 01/09/2021: Magnesium 1.9; TSH 3.187 04/23/2021: BUN 19; Creatinine, Ser 1.45; Hemoglobin 16.5; Platelets 134; Potassium 4.4; Sodium 136  Recent Lipid Panel No results found for: CHOL, TRIG, HDL, CHOLHDL, VLDL, LDLCALC, LDLDIRECT  Physical Exam:    VS:  BP (!) 160/104 (BP Location: Left Arm, Patient Position: Sitting, Cuff Size: Normal)   Pulse 66   Ht 6\' 3"  (1.905 m)   Wt 238 lb (108 kg)   SpO2 98%   BMI 29.75 kg/m     Wt Readings from Last 3 Encounters:  08/14/21 238 lb (108 kg)  05/28/21 231 lb 6.4 oz (105 kg)  04/30/21 220 lb (99.8 kg)     GEN:  Well nourished, well developed in no acute distress HEENT: Normal NECK: No JVD; No carotid bruits LYMPHATICS: No lymphadenopathy CARDIAC: RRR, no murmurs, rubs, gallops RESPIRATORY:  Clear to auscultation without rales, wheezing or rhonchi  ABDOMEN: Soft, non-tender, non-distended MUSCULOSKELETAL:  No edema; No deformity  SKIN: Warm and dry NEUROLOGIC:  Alert and oriented x 3 PSYCHIATRIC:  Normal affect        ASSESSMENT:    1. Persistent atrial fibrillation ([employer])   2. Chronic systolic heart failure ([place])   3. Primary hypertension    PLAN:    In order of problems listed above:  #Persistent atrial fibrillation Maintaining normal rhythm after an ablation. He can stop amiodarone today. Continue Xarelto for at least 1 year after the ablation procedure.  We will address this at our next appointment.  #Chronic systolic heart failure NYHA class I-II.  Warm and dry.  Continue Lasix, Entresto.  Rhythm control indicated.  #Hypertension Uncontrolled.  Increase Entresto today to 49-51mg  PO BID.  Add amlodipine 5 mg by mouth once daily.  He will need blood work checked in about 7-10 days.  I understand he has a  primary care appointment tomorrow with blood work scheduled.  Ideally, we would push out the blood work 7 to 10 days after increasing the dose of Entresto.  He will discuss with his primary care physician.  Recommend he continue to check his blood pressures 1-2 times per week at least.  Follow-up with our clinic in 9 months or sooner as needed.      Medication Adjustments/Labs and Tests Ordered: Current medicines are reviewed at length with the patient today.  Concerns regarding medicines are outlined above.  No orders of the defined types were placed in this encounter.  No orders of the defined types were placed in this encounter.    Signed, Lars Mage, MD, [facility], [facility] 08/14/2021 2:55 PM    Electrophysiology [facility]

## 2021-08-14 ENCOUNTER — Ambulatory Visit (INDEPENDENT_AMBULATORY_CARE_PROVIDER_SITE_OTHER): Payer: BC Managed Care – PPO | Admitting: Cardiology

## 2021-08-14 ENCOUNTER — Other Ambulatory Visit: Payer: Self-pay

## 2021-08-14 ENCOUNTER — Encounter: Payer: Self-pay | Admitting: Cardiology

## 2021-08-14 VITALS — BP 160/104 | HR 66 | Ht 75.0 in | Wt 238.0 lb

## 2021-08-14 DIAGNOSIS — I5022 Chronic systolic (congestive) heart failure: Secondary | ICD-10-CM | POA: Diagnosis not present

## 2021-08-14 DIAGNOSIS — I10 Essential (primary) hypertension: Secondary | ICD-10-CM | POA: Diagnosis not present

## 2021-08-14 DIAGNOSIS — I4819 Other persistent atrial fibrillation: Secondary | ICD-10-CM | POA: Diagnosis not present

## 2021-08-14 MED ORDER — AMLODIPINE BESYLATE 5 MG PO TABS: 5.0000 mg | ORAL_TABLET | Freq: Every day | ORAL | 3 refills | Status: DC

## 2021-08-14 MED ORDER — SACUBITRIL-VALSARTAN 49-51 MG PO TABS: 1.0000 | ORAL_TABLET | Freq: Two times a day (BID) | ORAL | 11 refills | Status: AC

## 2021-08-14 NOTE — Patient Instructions (Addendum)
Medication Instructions:  Your physician has recommended you make the following change in your medication:    STOP amiodarone  2.    START taking amlodipine 5 mg-  Take one tablet by mouth daily  3.  INCREASE your Entresto 49/51 mg-  Take one tablet by mouth twice a day   Lab Work: None ordered. If you have labs (blood work) drawn today and your tests are completely normal, you will receive your results only by: MyChart Message (if you have MyChart) OR A paper copy in the mail If you have any lab test that is abnormal or we need to change your treatment, we will call you to review the results.  Testing/Procedures: None ordered.  Follow-Up: At [facility], you and your health needs are our priority.  As part of our continuing mission to provide you with exceptional heart care, we have created designated Provider Care Teams.  These Care Teams include your primary Cardiologist (physician) and Advanced Practice Providers (APPs -  Physician Assistants and Nurse Practitioners) who all work together to provide you with the care you need, when you need it.  Your next appointment:   Your physician wants you to follow-up in: 9 months with one of the following Advanced Practice Providers on your designated Care Team:   Nicolasa Ducking, NP Eula Listen, PA-C Marisue Ivan, PA-C Cadence Fransico Michael, PA-C You will receive a reminder letter in the mail two months in advance. If you don't receive a letter, please call our office to schedule the follow-up appointment.

## 2021-10-20 IMAGING — US US RENAL ARTERY STENOSIS
1 series · 13 of 25 positions shown · non-contrast
Comparison: None.

CLINICAL DATA: Uncontrolled hypertension

EXAM:
RENAL/URINARY TRACT ULTRASOUND
RENAL DUPLEX DOPPLER ULTRASOUND

[Series 1: us renal artery stenosis · 0.34mm/px · 13 of 64 slices shown]
[im 1/64]
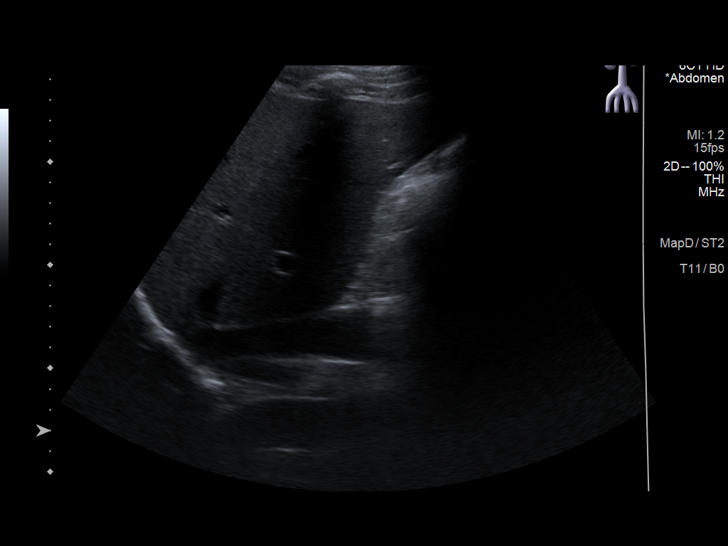
[im 6/64]
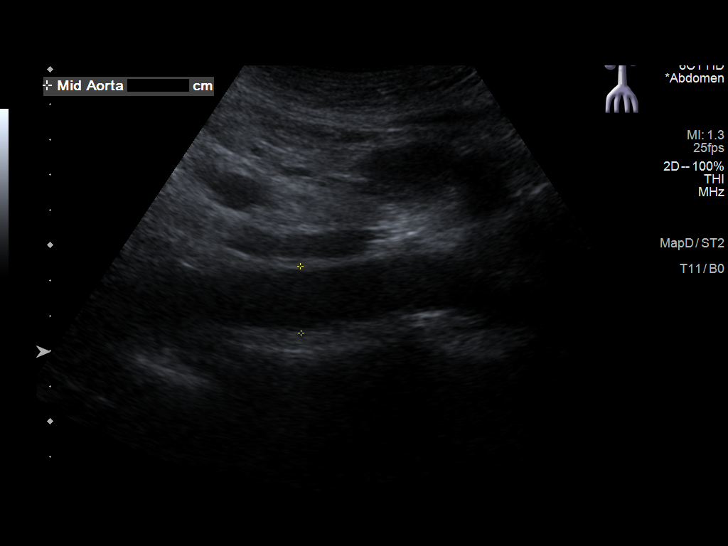
[im 11/64]
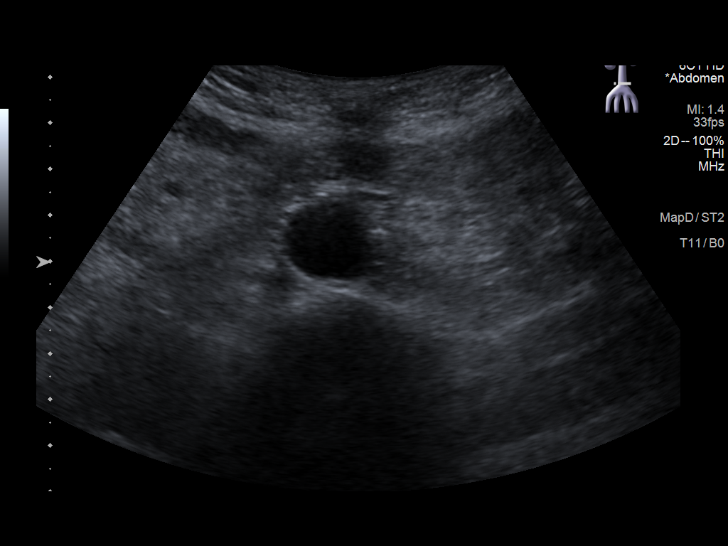
[im 16/64]
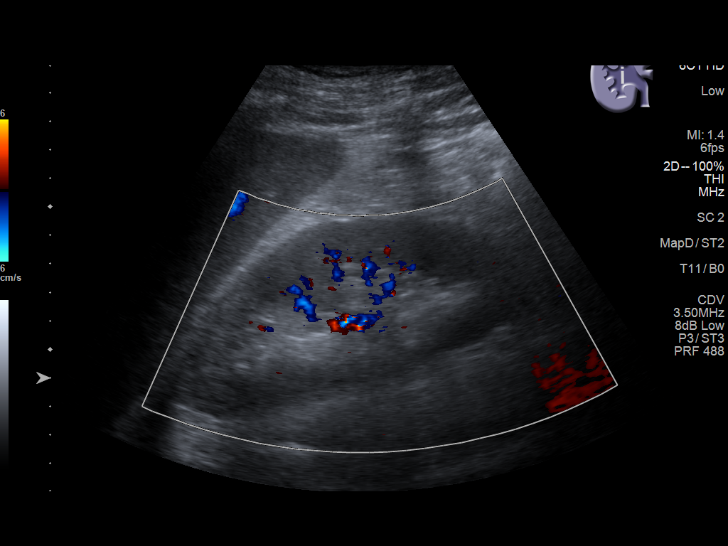
[im 22/64]
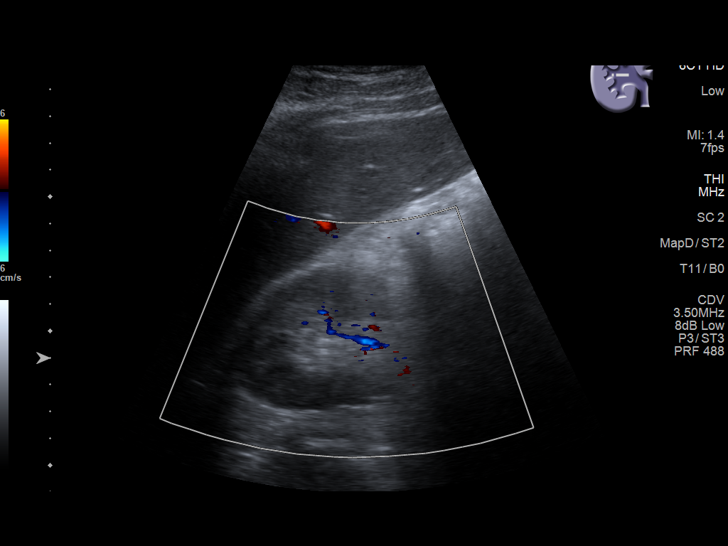
[im 27/64]
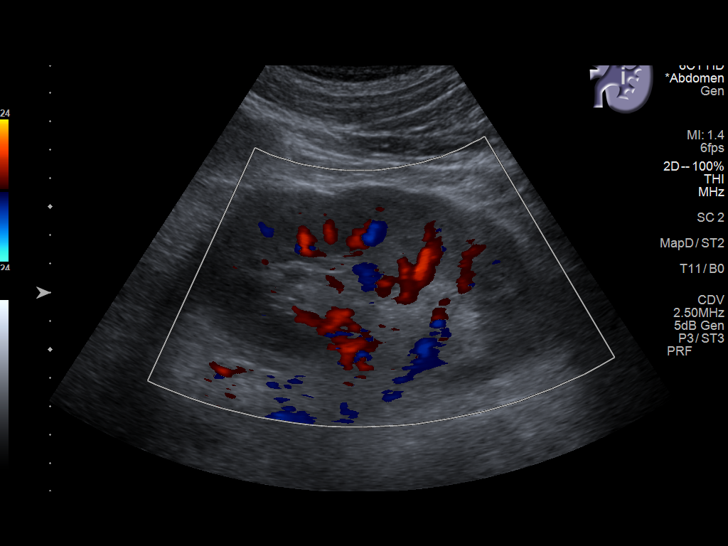
[im 32/64]
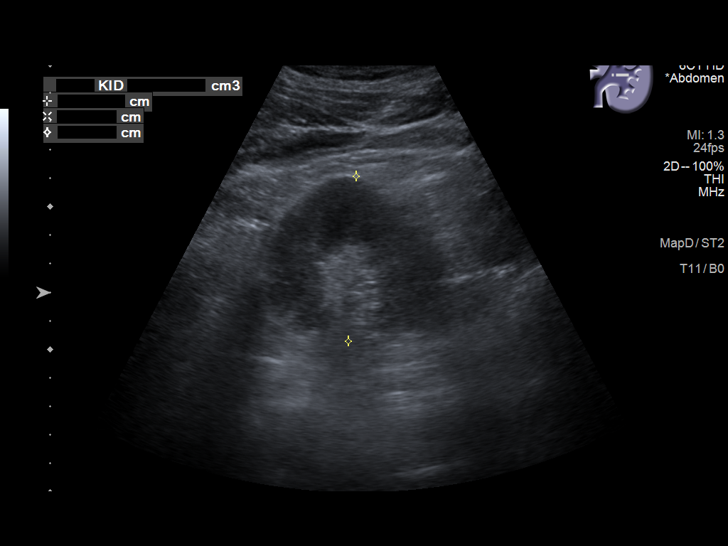
[im 37/64]
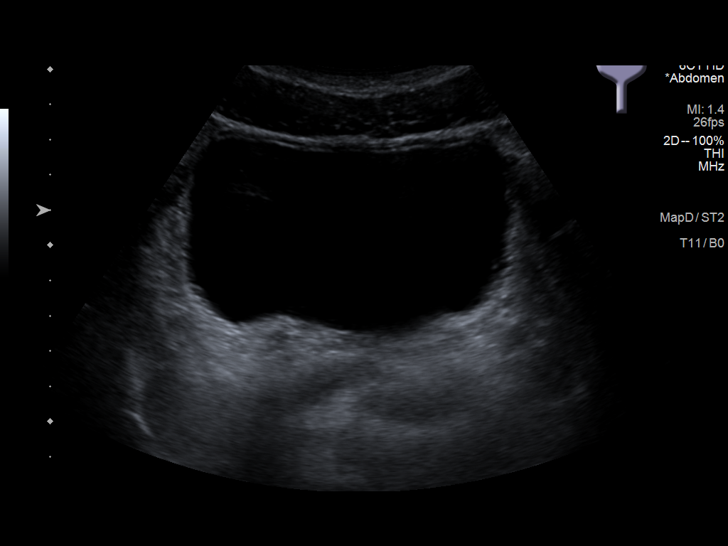
[im 43/64]
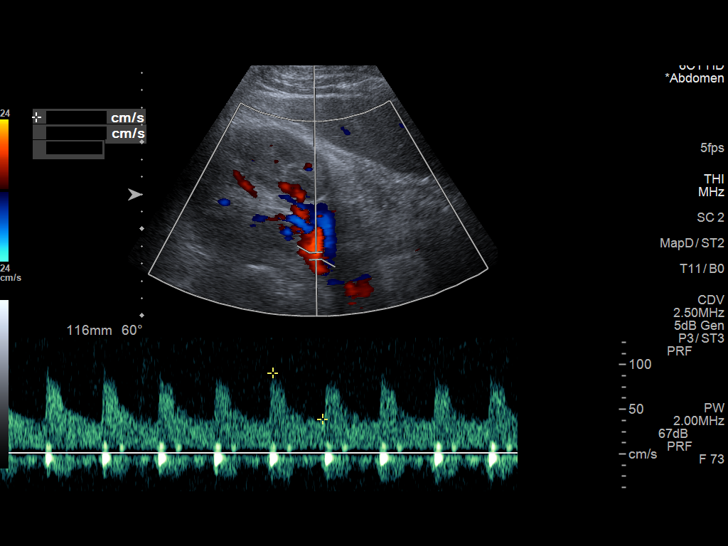
[im 48/64]
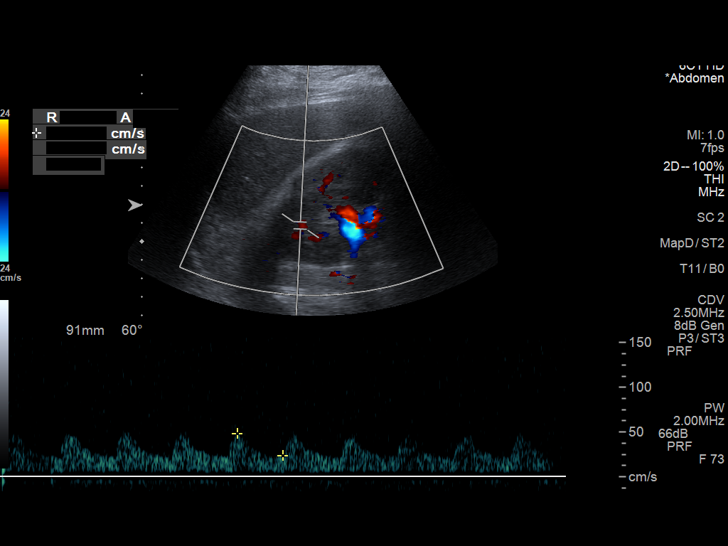
[im 53/64]
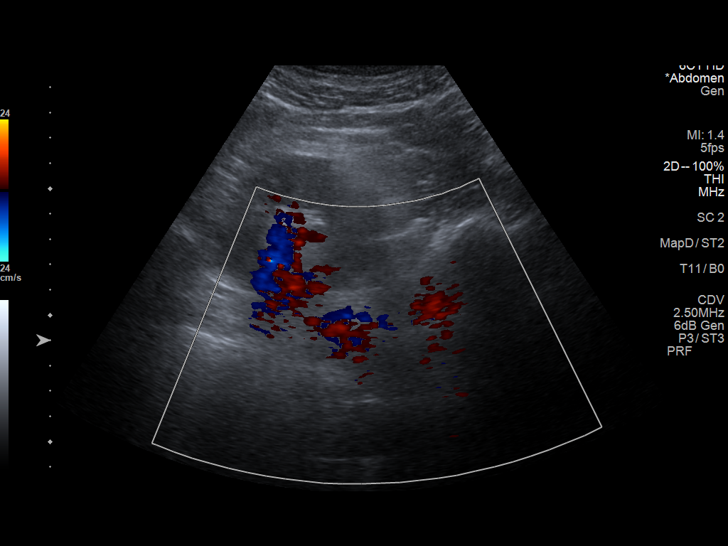
[im 58/64]
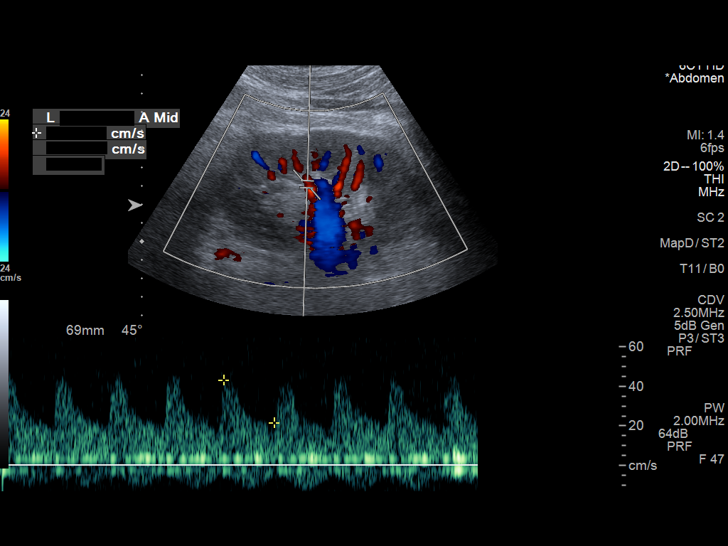
[im 64/64]
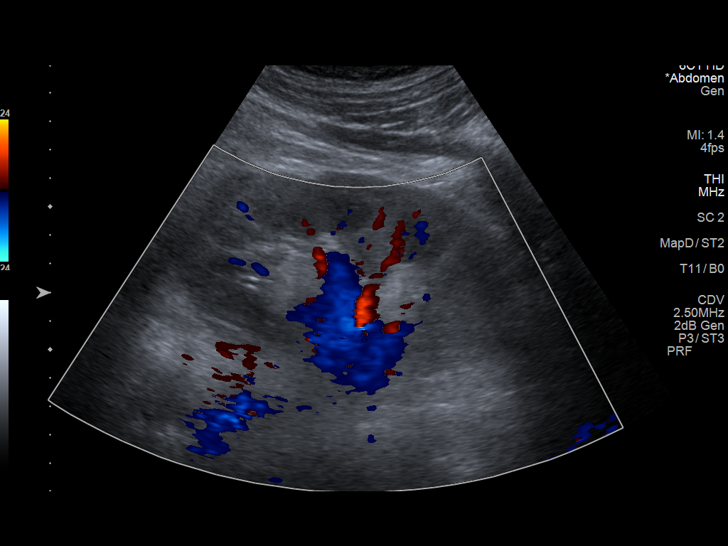

[13 of 25 positions shown; findings below may reference images not displayed]

FINDINGS: Right Kidney:

Length: 13.3. Renal vein patent at the hilum. Echogenicity within
normal limits. No mass or hydronephrosis visualized.

Left Kidney:

Length: 13. Renal vein patent at the hilum. Echogenicity within
normal limits. No mass or hydronephrosis visualized.

Bladder: Physiologically distended, unremarkable

RENAL DUPLEX ULTRASOUND

Right Renal Artery Velocities:

Origin:  86 cm/sec

Mid:  90 cm/sec

Hilum:  56 cm/sec

Interlobar:  63 cm/sec

Arcuate:  362 cm/sec

Left Renal Artery Velocities:

Origin:  66 cm/sec

Mid:  57 cm/sec

Hilum:  54 cm/sec

Interlobar:  54 cm/sec

Arcuate:  48 cm/sec

Aortic Velocity:  119 cm/sec

Right Renal-Aortic Ratios:

Origin:

Mid:

Hilum:

Interlobar:

Arcuate:

Left Renal-Aortic Ratios:

Origin:

Mid:

Hilum:

Interlobar:

Arcuate:

No evidence of abdominal aortic aneurysm.
IMPRESSION: 1. Negative. No ultrasound evidence of hemodynamically significant
renal artery stenosis.
If there is continued clinical concern, renal MRA (lower radiation
risk, can be performed noncontrast in the setting of renal
dysfunction) and CTA ( higher spatial resolution) represent more
accurate studies, which are additionally more sensitive to the
detection of duplicated renal arteries.

## 2022-01-16 IMAGING — DX DG CHEST 1V PORT
1 series · 1 of 1 positions shown · non-contrast
Comparison: None.

CLINICAL DATA: Chest pain and dyspnea

EXAM:
PORTABLE CHEST 1 VIEW

[chest ap]
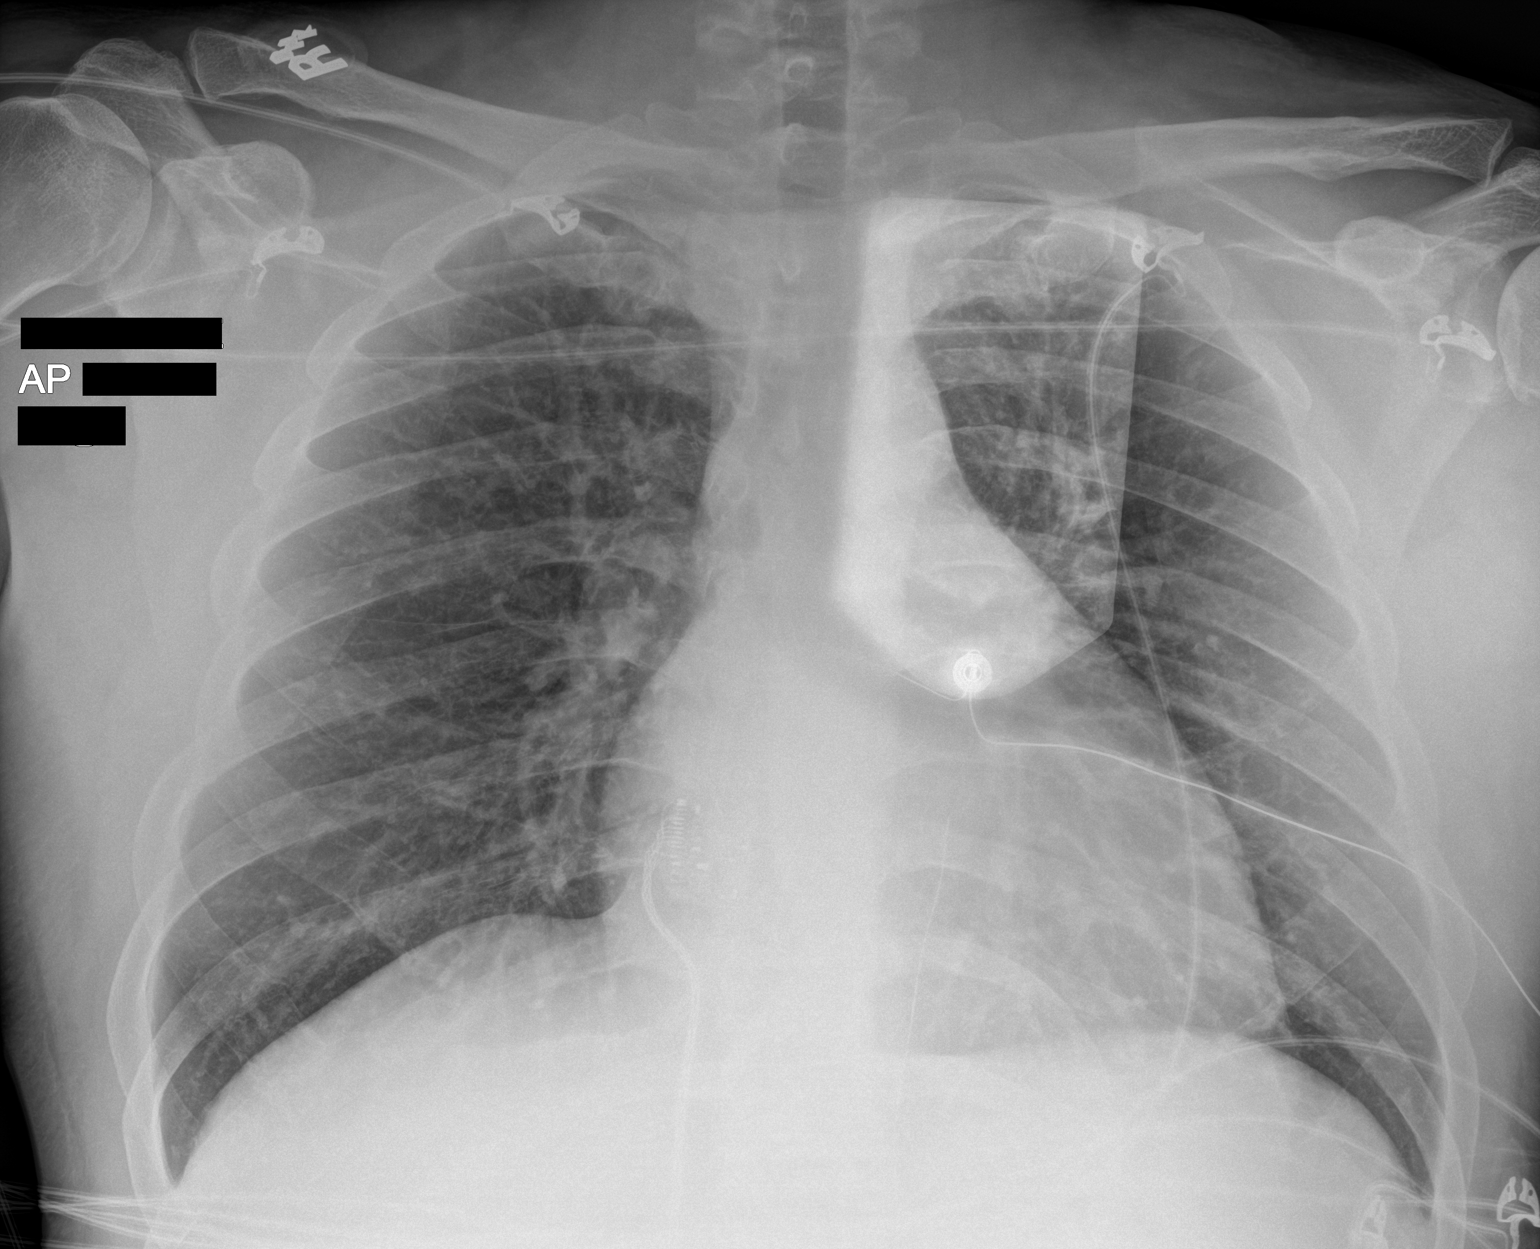

[1 of 1 positions shown; findings below may reference images not displayed]

FINDINGS: Pacer pad overlies the upper left chest. Top-normal heart size.
Normal mediastinal contour. No pneumothorax. No pleural effusion.
Lungs appear clear, with no acute consolidative airspace disease and
no pulmonary edema.
IMPRESSION: No active disease.

## 2022-07-04 ENCOUNTER — Encounter: Payer: Self-pay | Admitting: Cardiology

## 2022-08-02 ENCOUNTER — Other Ambulatory Visit: Payer: Self-pay | Admitting: Cardiology

## 2022-11-20 ENCOUNTER — Encounter (HOSPITAL_COMMUNITY): Payer: Self-pay | Admitting: *Deleted

## 2023-06-06 ENCOUNTER — Other Ambulatory Visit: Payer: Self-pay | Admitting: Cardiology

## 2023-06-19 ENCOUNTER — Other Ambulatory Visit: Payer: Self-pay | Admitting: Cardiology
